# Patient Record
Sex: Male | Born: 1955 | Race: White | Hispanic: No | Marital: Married
Health system: Midwestern US, Community
[De-identification: ages and names within clinical notes are randomized; demographics above are authoritative.]

## PROBLEM LIST (undated history)

## (undated) DIAGNOSIS — Z1211 Encounter for screening for malignant neoplasm of colon: Secondary | ICD-10-CM

## (undated) DIAGNOSIS — G459 Transient cerebral ischemic attack, unspecified: Secondary | ICD-10-CM

## (undated) DIAGNOSIS — K5732 Diverticulitis of large intestine without perforation or abscess without bleeding: Secondary | ICD-10-CM

## (undated) DIAGNOSIS — E785 Hyperlipidemia, unspecified: Secondary | ICD-10-CM

## (undated) DIAGNOSIS — Z0000 Encounter for general adult medical examination without abnormal findings: Secondary | ICD-10-CM

## (undated) DIAGNOSIS — R300 Dysuria: Secondary | ICD-10-CM

## (undated) DIAGNOSIS — E1165 Type 2 diabetes mellitus with hyperglycemia: Principal | ICD-10-CM

---

## 2016-01-12 NOTE — Telephone Encounter (Addendum)
Patient Identity Verified with 2 Methods        Call Type    Referrals   PRIORITY Normal   Call back at Home Phone [phone]   Coordination of Care: Called and spoke with patient he was not in front of his calander and said he would call back to set up 48 hour holter monitor.   Initial call taken by: Dewitt HoesJenifer Longoria,  Dec 25, 2015 1:11 PM         Follow-up for Phone Call    Follow-up Details: Pt LM today at 1240. He states he would like to have the procedure code to call his insurance compnay to see how much they are going to cover. Left call back number [phone]   Follow-up by: Seward GraterAshley Higgins,  Dec 28, 2015 1:27 PM      Additional Follow-up for Phone Call    Additional Follow-up Details: Holter monitor for 48 hours is 93224, [id number] and [id number] which these are all the codes [facility] would use this is not including [employer]'s fee. He has already gotten the preauth through his PCP/ Referring office.    Additional Follow-up by: Dewitt HoesJenifer Longoria,  Dec 28, 2015 3:54 PM      Additional Follow-up for Phone Call    Additional Follow-up Details: Called patient back to let know codes and he will call us to schedule this after he gets more financial information.    Additional Follow-up by: Dewitt HoesJenifer Longoria,  Dec 29, 2015 9:57 AM            Electronically signed by Dewitt HoesJenifer Longoria on 12/29/2015 at 9:57 AM   ________________________________________________________________________

## 2016-01-12 NOTE — Telephone Encounter (Addendum)
Patient Identity Verified with 2 Methods        Call Type    VM   PRIORITY Normal   Time of Voicemail 1103   Date of Voicemail 01/12/2016   VM Message:   Pt LM looking to set up holter monitor. left call back number of [phone]   Initial call taken by: Seward GraterAshley Higgins,  Jan 12, 2016 11:12 AM         Follow-up for Phone Call    Follow-up Details: I called pt back. He wanted as early as possible tomorrow. I scheudled pt for 9 am as there was already and 8 and 830   Follow-up by: Seward GraterAshley Higgins,  Jan 12, 2016 11:13 AM            Electronically signed by Seward GraterAshley Higgins on 01/12/2016 at 11:13 AM   ________________________________________________________________________

## 2016-01-19 NOTE — Telephone Encounter (Signed)
----   Converted from flag ----   ---- 01/18/2016 9:16 PM, Thomasene RippleJustin Pearlman MD PhD wrote:   done; 3-4 beat multiform PVC run alert via [employer] flag      ---- 01/18/2016 8:34 AM, Dewitt HoesJenifer Longoria wrote:   48 hour holter ready for read   ------------------------------         Electronically signed by Dewitt HoesJenifer Longoria on 01/19/2016 at 1:00 PM   ________________________________________________________________________

## 2016-02-09 NOTE — Telephone Encounter (Addendum)
Patient Identity Verified with 2 Methods        Call Type    Acute   PRIORITY Normal   Initial Call Started: Seward GraterAshley Higgins,  February 08, 2016 10:48 AM   Reason for Acute Call: Pt called in today to see Referral had been received. Just got off the phone with PCP 5 mins prior. I told pt I would call him later today if we did not receive it.          Follow-up for Phone Call    Follow-up Details: I called pt back. Let him know that we have not recieved yet. He does have an insurance that might require an [place]auth. I told pt I would call him by the end of the week if we haven't received. Pt thanked me for the call back.    Follow-up by: Seward GraterAshley Higgins,  February 08, 2016 3:28 PM            Electronically signed by Seward GraterAshley Higgins on 02/08/2016 at 3:28 PM   ________________________________________________________________________   Called patient back, we decided on Aug. 3rd but lost connection before time determined. I have scheduld for 1pm          Electronically signed by Dewitt HoesJenifer Longoria on 02/09/2016 at 3:37 PM   ________________________________________________________________________   pt called back and confirmed 1pm for time on 8/3         Electronically signed by Dewitt HoesJenifer Longoria on 02/09/2016 at 3:41 PM   ________________________________________________________________________

## 2016-03-21 NOTE — Progress Notes (Addendum)
Visit Type:  Initial Consult         History of Present Illness:   This 60 year old gentleman is being evaluated for the first time by our office for palpitations.  There is a history of type 2 diabetes and hyperlipidemia.   In late May of this year this gentleman presented to urgent care with symptoms of an upper respiratory infection.  Extrasystoles were heard and an EKG was performed.  This revealed frequent PVCs.  Subsequent Holter monitoring was performed and this reveals sinus rhythm with 6.8% of total complexes being ventricular in etiology.  Sustaining forms did not occur ventricular tachycardia did not occur but there are 187 couplets noted PVC burden 10 to happen more so during the day and less so at night.  Recent blood work demonstrated an an LDL cholesterol 106 HDL of 38 triglycerides 209 glycohemoglobin 9.3 potassium 4.7 sodium 141.  Cardiac stress testing done 10 years ago was interpreted as normal   Patient denies any symptoms although he tells me couple months ago he was aware of very transient dizziness and some chest tightness he cannot recall whether that was exertional or not.  Cannot recall to radiate he is not sure if it was associated with breathlessness diaphoresis or how long it lasted.  He does have significant risk factors with type 2 diabetes hypertension which he does check at home and is usually quite well controlled in the 120 range.  He also has hyperlipidemia.  He has a daughter who is a Publishing rights manager a son-in-law who is an anesthesiologist who is concerned about his health and his risk factors.  I've reviewed in detail his Holter monitor with him his PVC burden and I've explained the PVCs are sometimes innocent namely when they are associated with an asymptomatic person and no structural heart disease and ischemia and that is our goal to assess by proceeding with a stress echo will contact the patient regarding results I've asked him to call me if he  develops any new or worrisome symptomsThe Review of Systems has been reviewed and updated (if necessary) by myself.??    Flossie Wexler DO  March 17, 2016 1:32 PM    Med reconciliation was reviewed by myself.    Also please see the handwritten instructions for the patient               Depression Screening    Over the last 2 weeks has often been bothered by feeling down, depressed or hopeless:  No   Over the last 2 weeks has often been bothered by little interest or pleasure in doing things:  No      Tobacco Use    Never smoker      Do you use other tobacco products?  no   Have you been or are you currently exposed to second hand smoke?  no      For men, in the past year how often have you had 5 or more drinks a day? never   In the past year, how often have you used prescription drugs for non medical reasons? (example: getting high) never   In the past year, how often have you used illegal drugs?  never      Nutrition    Caffeine use (drinks/day) 1      Motor Vehicle Safety    Do you fasten your seat belt when you are in a vehicle?  yes   Do you ever drive after drinking,or ride with  a driver who has been drinking? no   Do you talk on your mobile device while driving? no   Do you text on your mobile device while driving?  no         Review of Systems       ENT        Complains of hearing loss.        Denies visual changes.      The review of systems is negative for CV, General, Resp, GI, GU, Neuro, Psych, Heme, Endo, Derm and MS.         Physical Exam      General:       well developed, well nourished, in no acute distress   Head:       normocephalic and atraumatic   Lungs:       clear bilaterally to A & P   Heart:       regular rate and rhythm, S1, S2 without murmurs, rubs, gallops, or clicks   Msk:       no deformity or scoliosis noted with normal posture and gait   Pulses:       pulses normal in all 4 extremities   Extremities:       no clubbing, cyanosis, edema, or deformity noted with normal full  range of motion of all joints   Skin:       intact without lesions or rashes   Psych:       alert and cooperative; normal mood and affect; normal attention span and concentration         Vital Signs    Transition of Care:  PCP   Patient Identity Verified with 2 Methods  Yes      Pain Assessment    Are you having significant pain today that you would like to discuss with the provider you are seeing today?no   BP Location:  Left arm   BP Cuff Size:  Regular      Final BP: 150 / 77   Blood Pressure: 150/77 mm Hg   Pulse #1 86   Respirations: 16 /min      Oxygen Saturation    O2 Sat #1 98% Activity at rest    Height: 75 inches    Entered Weight 216.0 lb Calculated Weight: 216 lb.  98.18kg   Body Mass Index: 27.10      Body Surface Area (m2): 2.27      Documented By: Mikey College RMA (March 17, 2016 1:02 PM)      Medications were reviewed and reconciled with the patient during this visit including over the counter medications.       Patient has no known allergies.         Allergies:    No Known Allergies   Allergies were reviewed with the patient during this visit.            Pulse Oximetry    O2 Sat #1 98%    Pulmonology Testing Scores    Epworth Sleepiness Scale score:  3  (Goal should be a score less than 9.)               Impression & Recommendations:      Problem # 1:  Chest tightness (EXB-284.13) (ICD10-R07.89)   Assessment: New   See HPI for plan the patient has not had any of this in the past several months despite being physically active including working out at the gym on  an elliptical, I've asked patient call me if he develops any new or unusual symptoms.  Plan is to communicate stress results to him that the stress test is negative we will assume his ventricular ectopics are benign.  I'm happy to reevaluate him as needed   Orders:   Ofc Vst, New Lvl 4 ([id number](CPT-99204)   Stress Echo ([id number](CPT-93350)         Problem # 2:  Hypertension (ICD-401.9) (ICD10-I10)   Assessment: Unchanged    This appears to be episodic and related to clinical visits per patient   His updated medication list for this problem includes:      Lisinopril 5 Mg Oral Tabs (Lisinopril) .Marland Kitchen.... 1 by mouth daily      Orders:   Ofc Vst, New Lvl 4 ([id number](CPT-99204)   Stress Echo ([id number](CPT-93350)         Problem # 3:  Mixed hyperlipidemia (ICD-272.2) (ICD10-E78.2)   Assessment: Unchanged   Continue statin therapy, monitor   His updated medication list for this problem includes:      Atorvastatin Calcium 20 Mg Oral Tabs (Atorvastatin calcium) .Marland Kitchen.... 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 1 by mouth daily      Orders:   Ofc Vst, New Lvl 4 ([id number](CPT-99204)   Stress Echo ([id number](CPT-93350)                     Orders:   Added new Service order of Ofc Vst, New Lvl 4 [phone](CPT-99204) - Signed   Added new Test order of Stress Echo ([id number](CPT-93350) - Signed      ]      EPWORTH SLEEPINESS SCALE   KEY:  <10 (Normal range), 10 to < 12 (Borderline), 12-24 (Abnormal)    Epworth Score: 3     Score Categorization: Normal range        KEY:  0 = Would never doze, 1 = Slight chance of dozing, 2 = Moderate chance of dozing, 3 = High chance of dozing    Sitting and Reading: 1   Watching TV: 1   Sitting inactive in a public place (ex. a theater or meeting): 0   As a passenger in a car for an hour without a break: 0   Lying down to rest in the afternoon when circumstances permit: 1   Sitting and talking to someone: 0   Sitting quietly after a lunch without alcohol: 0   In a car, while stopped for a few minutes in traffic:  0            Electronically signed by Lorin PicketScott Anaisabel Pederson DO on 03/17/2016 at 1:36 PM   ________________________________________________________________________

## 2016-04-13 NOTE — Telephone Encounter (Addendum)
Patient Identity Verified with 2 Methods        Call Type    VM   PRIORITY Normal   Time of Voicemail 1222   Date of Voicemail 04/13/2016   VM Message:   PT LM stating he was returning a call. Left call back of [phone].    Initial call taken by: Kathrine Haddock,  April 13, 2016 1:26 PM         Follow-up for Phone Call    Follow-up Details: I don't see a note in the chart will forward to provider to see if was trying to reach.    Follow-up by: Kathrine Haddock,  April 13, 2016 1:27 PM      Additional Follow-up for Phone Call    Additional Follow-up Details: I don't recall trying to reach the patient it's been approximately a month since he was here I see that his stress test has not been performed, it's been nearly a month are we out that long for scheduling?  Continue check to see what the status of the stress test is?  Was it upstairs trying to call him to schedule this?   Additional Follow-up by: Donaciano Eva DO,  April 13, 2016 1:46 PM      Additional Follow-up for Phone Call    Additional Follow-up Details: I called Shanda Bumps from [facility] to see if it was her. She is going to check. She was in the middle of another chart is going to call back.    Additional Follow-up by: Kathrine Haddock,  April 13, 2016 2:05 PM      Additional Follow-up for Phone Call    Additional Follow-up Details: Shanda Bumps called back. It was pre registration for pt's scan. Shanda Bumps states pt has spoken to them now and is all set for 9/12 appt.    Additional Follow-up by: Kathrine Haddock,  April 13, 2016 2:12 PM            Electronically signed by Kathrine Haddock on 04/13/2016 at 2:12 PM   Electronically signed by Donaciano Eva DO on 04/13/2016 at 2:31 PM   ________________________________________________________________________

## 2016-04-26 NOTE — Telephone Encounter (Signed)
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  Please asked him to call as needed for any worrisome symptoms and please asked him to make an appointment to see me in a year thank you   ------------------------------      Call Type    Results   PRIORITY Normal   Results:   Phoned [name], [facility] to contact the office.    Initial call taken by: Mikey College RMA,  April 26, 2016 2:42 PM         Follow-up for Phone Call    Follow-up Details: pt called back is advised of above. Letters will go out for 1 year follow up.    Follow-up by: Kathrine Haddock,  April 26, 2016 3:28 PM            Electronically signed by Kathrine Haddock on 04/26/2016 at 3:28 PM   ________________________________________________________________________

## 2018-08-31 DIAGNOSIS — R55 Syncope and collapse: Secondary | ICD-10-CM

## 2018-08-31 NOTE — ED Triage Notes (Signed)
States has had a couple of drinks tonight, wife stares he was pale and sweaty at the time. Does not remember getting up between falls. 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 States did not feel like he was having pvcs tonight.

## 2018-08-31 NOTE — ED Provider Notes (Signed)
Patient is a 63 year old male whom describes history of hypertension as well as diabetes for which he takes medications as curiously he has spent the last couple of weeks in [place], returning 48 hours ago during which time he inadvertently did not have his lisinopril as he returned home 48 hours ago has re-initiated this at a routine 5 mg daily dose as over the last 3 days he has had significant sinus congestion cough and nasal stuffiness for which he is taking an over-the-counter Mucinex DM product as a this evening he was simply standing around with friends having a few drinks when suddenly his wife observed that he seemed to stare off for a brief moment before he was collapsing to the ground possibly landing on his butt and elbows.  She and friends nearly immediately attempted to help him stand because he appeared to be awake and yet he again crumpled to the ground at this time seemingly unresponsive to communications as there was no evidence of seizure activity he had no loss of urine as he did become profoundly diaphoretic and ???he looked gray??? the wife states friends immediately called for an ambulance as after a few minutes possibly 2 3 or 5 he began to ask what was going on he began to recognize them he immediately began asking questions that were coherent and he seemed understand as his color seemed to quickly improve and any diaphoresis resolved.  The patient recalls slowly the reality of waking up on the floor he vaguely recalls feeling hot immediately before he collapsed he has no recollection of being assisted back up off the ground before collapsing again he was unaware of any headache, chest pain, palpitations, dyspnea, lightheadedness nor weakness nor visual change.  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  With the exception of the elimination of his lisinopril for 2  weeks he has had no medication changes he states with the cold symptoms he is unaware of any fever nor chills he has been eating regularly he has had no vomiting or diarrhea he has had no rash no urinary symptoms.  The patient states he had no paresthesias he did not could lose control of his urine he did not suffer any injury he was not observed to have any twitching or seizure activity    The history is provided by the patient.   Syncope    This is a new problem. The current episode started less than 1 hour ago. The problem has been resolved. He lost consciousness for a period of 1 to 5 minutes. Associated symptoms include congestion and light-headedness (Patient states heHas the vagus of feelings of lightheadedness while sitting in our emergency room waiting room). Pertinent negatives include no visual change, no chest pain, no palpitations, no confusion, no fever, no abdominal pain, no bowel incontinence, no vomiting, no bladder incontinence, no headaches, no back pain, no dizziness, no focal weakness, no seizures, no slurred speech, no weakness and no head injury.        No past medical history on file.    No past surgical history on file.      No family history on file.    Social History     Socioeconomic History   ??? Marital status: MARRIED     Spouse name: Not on file   ??? Number of children: Not  on file   ??? Years of education: Not on file   ??? Highest education level: Not on file   Occupational History   ??? Not on file   Social Needs   ??? Financial resource strain: Not on file   ??? Food insecurity:     Worry: Not on file     Inability: Not on file   ??? Transportation needs:     Medical: Not on file     Non-medical: Not on file   Tobacco Use   ??? Smoking status: Not on file   Substance and Sexual Activity   ??? Alcohol use: Not on file   ??? Drug use: Not on file   ??? Sexual activity: Not on file   Lifestyle   ??? Physical activity:     Days per week: Not on file     Minutes per session: Not on file    ??? Stress: Not on file   Relationships   ??? Social connections:     Talks on phone: Not on file     Gets together: Not on file     Attends religious service: Not on file     Active member of club or organization: Not on file     Attends meetings of clubs or organizations: Not on file     Relationship status: Not on file   ??? Intimate partner violence:     Fear of current or ex partner: Not on file     Emotionally abused: Not on file     Physically abused: Not on file     Forced sexual activity: Not on file   Other Topics Concern   ??? Not on file   Social History Narrative   ??? Not on file         ALLERGIES: Patient has no allergy information on record.    Review of Systems   Constitutional: Negative for chills and fever.   HENT: Positive for congestion. Negative for sore throat.    Respiratory: Negative for cough.    Cardiovascular: Positive for syncope. Negative for chest pain and palpitations.   Gastrointestinal: Negative for abdominal pain, bowel incontinence, diarrhea and vomiting.   Genitourinary: Negative for bladder incontinence, difficulty urinating and dysuria.   Musculoskeletal: Negative for back pain and myalgias.   Skin: Negative for rash.   Neurological: Positive for light-headedness (Patient states heHas the vagus of feelings of lightheadedness while sitting in our emergency room waiting room). Negative for dizziness, focal weakness, seizures, weakness and headaches.   Psychiatric/Behavioral: Negative for confusion.       Vitals:    08/31/18 2143   BP: 132/74   Pulse: 91   Resp: 18   Temp: 98.1 ??F (36.7 ??C)   SpO2: 96%            Physical Exam  Vitals signs and nursing note reviewed.   Constitutional:       General: He is not in acute distress.     Appearance: He is well-developed. He is not ill-appearing or diaphoretic.   HENT:      Head: Atraumatic.      Right Ear: Tympanic membrane normal. No hemotympanum.      Left Ear: Tympanic membrane normal. No hemotympanum.      Mouth/Throat:       Pharynx: Uvula midline. No pharyngeal swelling or posterior oropharyngeal erythema.   Eyes:      Pupils: Pupils are equal, round, and reactive to light.  Neck:      Musculoskeletal: Neck supple.      Vascular: No JVD.      Trachea: Trachea normal.   Cardiovascular:      Rate and Rhythm: Normal rate and regular rhythm.      Pulses:           Radial pulses are 2+ on the right side and 2+ on the left side.        Dorsalis pedis pulses are 2+ on the right side and 2+ on the left side.      Heart sounds: Normal heart sounds. No murmur.   Pulmonary:      Effort: Pulmonary effort is normal. No respiratory distress.      Breath sounds: Normal breath sounds. No decreased breath sounds.   Abdominal:      Palpations: Abdomen is soft.      Tenderness: There is no tenderness.   Musculoskeletal:         General: No swelling or tenderness.      Right lower leg: No edema.      Left lower leg: No edema.   Lymphadenopathy:      Cervical: No cervical adenopathy.      Upper Body:      Right upper body: No supraclavicular adenopathy.      Left upper body: No supraclavicular adenopathy.   Skin:     General: Skin is warm and dry.   Neurological:      General: No focal deficit present.      Mental Status: He is alert and oriented to person, place, and time.      Cranial Nerves: Cranial nerves are intact.      Sensory: Sensation is intact.      Motor: Motor function is intact.      Coordination: Romberg sign negative. Finger-Nose-Finger Test normal.      Gait: Gait is intact.   Psychiatric:         Attention and Perception: Attention and perception normal.         Mood and Affect: Mood and affect normal. Mood is not anxious.         Speech: Speech normal.         Behavior: Behavior normal. Behavior is cooperative.         Thought Content: Thought content normal.         Cognition and Memory: Cognition and memory normal.         Judgment: Judgment normal.          MDM  Number of Diagnoses or Management Options   Syncope and collapse: new and requires workup  Diagnosis management comments: Assuredly this patient who appears to have suffered a true syncopal episode without obvious seizure activity with no significant preceding nor subsequent symptoms who does not thought to have suffered an injury requires evaluation for this syncope we will attempt to rule out concerns of arrhythmia depending on symptoms I have no concern of stroke doubt acute coronary syndrome doubt infection with alcohol level returned at 80 prior to me seeing him do not believe it is secondary to his alcohol consumption we will observe for orthostatic changes evaluate his gait subsequent to a time of observation as we discussed this plan from outset with patient       Amount and/or Complexity of Data Reviewed  Clinical lab tests: ordered and reviewed    Patient Progress  Patient progress: improved    ED Course as of  Sep 01 600   Sat Sep 01, 2018   0115 Patient showing no evidence of ectopy here heart rate continues to run in the 80 range, some vague lightheadedness remains as when he stands he actually feels slightly off balance here we have not yet obtained orthostatics (miscommunication with nursing0 will check on these if abnormal we will provide fluid bolus if not despite normal neurologic exam else wise will obtain a head CT provide some Antivert as will continue to observe him on a monitor    [CA]   0131 CuriouslyAlthough blood pressure falls only small amount heart rate does jump significantly with standing as he again feels rather unsteady to this end we have discussed providin a  of of saline as well as a dose of Antivert and yet despite the normal neurologic examination based on true loss of consciousness the subsequent unsteadiness believe it is reasonable to obtain head CT is he seems appreciative of these plans/recommendations    [CA]   [phone] Patient describes to nursing after fluids and Antivert he is feeling  improved less concerns of unsteadiness/dizziness awaiting repeat troponin as CT appears to show no acute intracranial abnormality    [CA]      ED Course User Index  [CA] Corine Shelter, MD       Procedures      NIH Stroke Scale          Lab findings during this visit (only abnormal values will be noted, if no value noted then the result was normal range):  Labs Reviewed   CBC WITH AUTOMATED DIFF - Abnormal; Notable for the following components:       Result Value    RDW 13.7 (*)     ABS. MONOCYTES 1.2 (*)     All other components within normal limits   METABOLIC PANEL, COMPREHENSIVE - Abnormal; Notable for the following components:    Chloride 99 (*)     Glucose 162 (*)     Creatinine 1.29 (*)     GFR est non-AA 60 (*)     ALT (SGPT) 64 (*)     AST (SGOT) 47 (*)     Alk. phosphatase 102 (*)     All other components within normal limits   ETHYL ALCOHOL - Abnormal; Notable for the following components:    ETHYL ALCOHOL 80.9 (*)     All other components within normal limits   TROPONIN I   D DIMER   MAGNESIUM   TROPONIN I       Radiology studies during this visit  Ct Head Wo Cont    Result Date: 09/01/2018  1. No acute intracranial abnormality. 2. Mild paranasal sinus disease. THIS IS AN ELECTRONICALLY VERIFIED REPORT Dictated By:  Marcell Barlow MD Dictated:  31 01:57:45 Transcribed By:  Self-Edited - PowerScribe Transcribed:  09/01/2018 01:58:22 Signed By:  Marcell Barlow MD Signed:  09/01/2018 02:01:48 For questions regarding this report please contact [facility] at [phone] [facility]: [id number]      Medications given in the ED:  Medications   sodium chloride (NS) flush 5-10 mL (has no administration in time range)   sodium chloride 0.9 % bolus infusion 1,000 mL (0 mL IntraVENous IV Completed 09/01/18 0305)   meclizine (ANTIVERT) tablet 50 mg (50 mg Oral Given 09/01/18 0155)       Diagnosis:    ICD-10-CM ICD-9-CM   1. Syncope and collapse R55 780.2       Condition at disposition:  Condition improved     Disposition:  Discharged    Discharge prescriptions and/or changes if applicable:  There are no discharge medications for this patient.      Follow-up:  Cyril Mourning, MD  [address]  [place] [address]  [phone]      As needed    [place]  [address]  [place] [address]  [phone]    If symptoms worsen      Please note that portions of this document were created using the M*Modal Fluency Direct dictation system.  Any inconsistencies or typographical errors may be the result of mis-transcription that persist in spite of proof-reading and should be addressed with the document creator.

## 2018-08-31 NOTE — ED Provider Notes (Signed)
Patient is a 63 year old male whom describes history of hypertension as well as diabetes for which he takes medications as curiously he has spent the last couple of weeks in [place], returning 48 hours ago during which time he inadvertently did not have his lisinopril as he returned home 48 hours ago has re-initiated this at a routine 5 mg daily dose as over the last 3 days he has had significant sinus congestion cough and nasal stuffiness for which he is taking an over-the-counter Mucinex DM product as a this evening he was simply standing around with friends having a few drinks when suddenly his wife observed that he seemed to stare off for a brief moment before he was collapsing to the ground possibly landing on his butt and elbows.  She and friends nearly immediately attempted to help him stand because he appeared to be awake and yet he again crumpled to the ground at this time seemingly unresponsive to communications as there was no evidence of seizure activity he had no loss of urine as he did become profoundly diaphoretic and ???he looked gray??? the wife states friends immediately called for an ambulance as after a few minutes possibly 2 3 or 5 he began to ask what was going on he began to recognize them he immediately began asking questions that were coherent and he seemed understand as his color seemed to quickly improve and any diaphoresis resolved.  The patient recalls slowly the reality of waking up on the floor he vaguely recalls feeling hot immediately before he collapsed he has no recollection of being assisted back up off the ground before collapsing again he was unaware of any headache, chest pain, palpitations, dyspnea, lightheadedness nor weakness nor visual change.  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  With the exception of the elimination of his lisinopril for 2  weeks he has had no medication changes he states with the cold symptoms he is unaware of any fever nor chills he has been eating regularly he has had no vomiting or diarrhea he has had no rash no urinary symptoms.  The patient states he had no paresthesias he did not could lose control of his urine he did not suffer any injury he was not observed to have any twitching or seizure activity    The history is provided by the patient.   Syncope    This is a new problem. The current episode started less than 1 hour ago. The problem has been resolved. He lost consciousness for a period of 1 to 5 minutes. Associated symptoms include congestion and light-headedness (Patient states heHas the vagus of feelings of lightheadedness while sitting in our emergency room waiting room). Pertinent negatives include no visual change, no chest pain, no palpitations, no confusion, no fever, no abdominal pain, no bowel incontinence, no vomiting, no bladder incontinence, no headaches, no back pain, no dizziness, no focal weakness, no seizures, no slurred speech, no weakness and no head injury.        No past medical history on file.    No past surgical history on file.      No family history on file.    Social History     Socioeconomic History   ??? Marital status: MARRIED     Spouse name: Not on file   ??? Number of children: Not  on file   ??? Years of education: Not on file   ??? Highest education level: Not on file   Occupational History   ??? Not on file   Social Needs   ??? Financial resource strain: Not on file   ??? Food insecurity:     Worry: Not on file     Inability: Not on file   ??? Transportation needs:     Medical: Not on file     Non-medical: Not on file   Tobacco Use   ??? Smoking status: Not on file   Substance and Sexual Activity   ??? Alcohol use: Not on file   ??? Drug use: Not on file   ??? Sexual activity: Not on file   Lifestyle   ??? Physical activity:     Days per week: Not on file     Minutes per session: Not on file   ??? Stress: Not on file    Relationships   ??? Social connections:     Talks on phone: Not on file     Gets together: Not on file     Attends religious service: Not on file     Active member of club or organization: Not on file     Attends meetings of clubs or organizations: Not on file     Relationship status: Not on file   ??? Intimate partner violence:     Fear of current or ex partner: Not on file     Emotionally abused: Not on file     Physically abused: Not on file     Forced sexual activity: Not on file   Other Topics Concern   ??? Not on file   Social History Narrative   ??? Not on file         ALLERGIES: Patient has no allergy information on record.    Review of Systems   Constitutional: Negative for chills and fever.   HENT: Positive for congestion. Negative for sore throat.    Respiratory: Negative for cough.    Cardiovascular: Positive for syncope. Negative for chest pain and palpitations.   Gastrointestinal: Negative for abdominal pain, bowel incontinence, diarrhea and vomiting.   Genitourinary: Negative for bladder incontinence, difficulty urinating and dysuria.   Musculoskeletal: Negative for back pain and myalgias.   Skin: Negative for rash.   Neurological: Positive for light-headedness (Patient states heHas the vagus of feelings of lightheadedness while sitting in our emergency room waiting room). Negative for dizziness, focal weakness, seizures, weakness and headaches.   Psychiatric/Behavioral: Negative for confusion.       Vitals:    08/31/18 2143   BP: 132/74   Pulse: 91   Resp: 18   Temp: 98.1 ??F (36.7 ??C)   SpO2: 96%            Physical Exam  Vitals signs and nursing note reviewed.   Constitutional:       General: He is not in acute distress.     Appearance: He is well-developed. He is not ill-appearing or diaphoretic.   HENT:      Head: Atraumatic.      Right Ear: Tympanic membrane normal. No hemotympanum.      Left Ear: Tympanic membrane normal. No hemotympanum.      Mouth/Throat:      Pharynx: Uvula midline. No pharyngeal  swelling or posterior oropharyngeal erythema.   Eyes:      Pupils: Pupils are equal, round, and reactive to light.  Neck:      Musculoskeletal: Neck supple.      Vascular: No JVD.      Trachea: Trachea normal.   Cardiovascular:      Rate and Rhythm: Normal rate and regular rhythm.      Pulses:           Radial pulses are 2+ on the right side and 2+ on the left side.        Dorsalis pedis pulses are 2+ on the right side and 2+ on the left side.      Heart sounds: Normal heart sounds. No murmur.   Pulmonary:      Effort: Pulmonary effort is normal. No respiratory distress.      Breath sounds: Normal breath sounds. No decreased breath sounds.   Abdominal:      Palpations: Abdomen is soft.      Tenderness: There is no tenderness.   Musculoskeletal:         General: No swelling or tenderness.      Right lower leg: No edema.      Left lower leg: No edema.   Lymphadenopathy:      Cervical: No cervical adenopathy.      Upper Body:      Right upper body: No supraclavicular adenopathy.      Left upper body: No supraclavicular adenopathy.   Skin:     General: Skin is warm and dry.   Neurological:      General: No focal deficit present.      Mental Status: He is alert and oriented to person, place, and time.      Cranial Nerves: Cranial nerves are intact.      Sensory: Sensation is intact.      Motor: Motor function is intact.      Coordination: Romberg sign negative. Finger-Nose-Finger Test normal.      Gait: Gait is intact.   Psychiatric:         Attention and Perception: Attention and perception normal.         Mood and Affect: Mood and affect normal. Mood is not anxious.         Speech: Speech normal.         Behavior: Behavior normal. Behavior is cooperative.         Thought Content: Thought content normal.         Cognition and Memory: Cognition and memory normal.         Judgment: Judgment normal.          MDM  Number of Diagnoses or Management Options  Syncope and collapse: new and requires workup  Diagnosis management  comments: Assuredly this patient who appears to have suffered a true syncopal episode without obvious seizure activity with no significant preceding nor subsequent symptoms who does not thought to have suffered an injury requires evaluation for this syncope we will attempt to rule out concerns of arrhythmia depending on symptoms I have no concern of stroke doubt acute coronary syndrome doubt infection with alcohol level returned at 80 prior to me seeing him do not believe it is secondary to his alcohol consumption we will observe for orthostatic changes evaluate his gait subsequent to a time of observation as we discussed this plan from outset with patient       Amount and/or Complexity of Data Reviewed  Clinical lab tests: ordered and reviewed    Patient Progress  Patient progress: improved    ED Course as of  Sep 01 600   Sat Sep 01, 2018   0115 Patient showing no evidence of ectopy here heart rate continues to run in the 80 range, some vague lightheadedness remains as when he stands he actually feels slightly off balance here we have not yet obtained orthostatics (miscommunication with nursing0 will check on these if abnormal we will provide fluid bolus if not despite normal neurologic exam else wise will obtain a head CT provide some Antivert as will continue to observe him on a monitor    [CA]   0131 CuriouslyAlthough blood pressure falls only small amount heart rate does jump significantly with standing as he again feels rather unsteady to this end we have discussed providin a  of of saline as well as a dose of Antivert and yet despite the normal neurologic examination based on true loss of consciousness the subsequent unsteadiness believe it is reasonable to obtain head CT is he seems appreciative of these plans/recommendations    [CA]   [phone] Patient describes to nursing after fluids and Antivert he is feeling improved less concerns of unsteadiness/dizziness awaiting repeat troponin as CT appears to show no  acute intracranial abnormality    [CA]      ED Course User Index  [CA] Corine Shelter, MD       Procedures      NIH Stroke Scale          Lab findings during this visit (only abnormal values will be noted, if no value noted then the result was normal range):  Labs Reviewed   CBC WITH AUTOMATED DIFF - Abnormal; Notable for the following components:       Result Value    RDW 13.7 (*)     ABS. MONOCYTES 1.2 (*)     All other components within normal limits   METABOLIC PANEL, COMPREHENSIVE - Abnormal; Notable for the following components:    Chloride 99 (*)     Glucose 162 (*)     Creatinine 1.29 (*)     GFR est non-AA 60 (*)     ALT (SGPT) 64 (*)     AST (SGOT) 47 (*)     Alk. phosphatase 102 (*)     All other components within normal limits   ETHYL ALCOHOL - Abnormal; Notable for the following components:    ETHYL ALCOHOL 80.9 (*)     All other components within normal limits   TROPONIN I   D DIMER   MAGNESIUM   TROPONIN I       Radiology studies during this visit  Ct Head Wo Cont    Result Date: 09/01/2018  1. No acute intracranial abnormality. 2. Mild paranasal sinus disease. THIS IS AN ELECTRONICALLY VERIFIED REPORT Dictated By:  Marcell Barlow MD Dictated:  26 01:57:45 Transcribed By:  Self-Edited - PowerScribe Transcribed:  09/01/2018 01:58:22 Signed By:  Marcell Barlow MD Signed:  09/01/2018 02:01:48 For questions regarding this report please contact [facility] at [phone] [facility]: [email or web address]      Medications given in the ED:  Medications   sodium chloride (NS) flush 5-10 mL (has no administration in time range)   sodium chloride 0.9 % bolus infusion 1,000 mL (0 mL IntraVENous IV Completed 09/01/18 0305)   meclizine (ANTIVERT) tablet 50 mg (50 mg Oral Given 09/01/18 0155)       Diagnosis:    ICD-10-CM ICD-9-CM   1. Syncope and collapse R55 780.2       Condition at disposition:  Condition improved    Disposition:  Discharged    Discharge prescriptions and/or changes if applicable:  There are no  discharge medications for this patient.      Follow-up:  Cyril Mourning, MD  [address]  [place] [address]  [phone]      As needed    [facility]  [address]  [place] [address]  [phone]    If symptoms worsen      Please note that portions of this document were created using the M*Modal Fluency Direct dictation system.  Any inconsistencies or typographical errors may be the result of mis-transcription that persist in spite of proof-reading and should be addressed with the document creator.

## 2018-08-31 NOTE — ED Notes (Signed)
States has had a couple of drinks tonight, wife stares he was pale and sweaty at the time. Does not remember getting up between falls. 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 States did not feel like he was having pvcs tonight.

## 2018-09-01 ENCOUNTER — Inpatient Hospital Stay
Admit: 2018-09-01 | Discharge: 2018-09-01 | Disposition: A | Discharge status: Home / Self Care | Payer: PRIVATE HEALTH INSURANCE | Attending: Emergency Medicine

## 2018-09-01 ENCOUNTER — Emergency Department: Admit: 2018-09-01 | Payer: PRIVATE HEALTH INSURANCE

## 2018-09-01 LAB — TROPONIN
Troponin I: <0.04 ng/mL (ref <0.040)
Troponin I: <0.04 ng/mL (ref <0.040)

## 2018-09-01 LAB — CBC WITH AUTO DIFFERENTIAL
Basophils %: 0 %
Basophils Absolute: 0 10*3/uL (ref 0.0–0.2)
Eosinophils %: 3 %
Eosinophils Absolute: 0.2 10*3/uL (ref 0.0–0.5)
Granulocyte Absolute Count: 0.1 10*3/uL (ref 0.0–0.1)
Hematocrit: 44.6 % (ref 42.0–52.0)
Hemoglobin: 14.9 g/dL (ref 14.0–18.0)
Immature Granulocytes: 1 %
Lymphocytes %: 21 %
Lymphocytes Absolute: 1.4 10*3/uL (ref 1.0–4.5)
MCH: 29.8 PG (ref 28.0–34.0)
MCHC: 33.4 g/dL (ref 32.0–36.0)
MCV: 89.2 FL (ref 80.0–100.0)
MPV: 8.4 FL (ref 7.0–12.0)
Monocytes %: 17 %
Monocytes Absolute: 1.2 10*3/uL — ABNORMAL HIGH (ref 0.1–0.8)
NRBC Absolute: 0 10*3/uL
Neutrophils %: 58 %
Neutrophils Absolute: 3.8 10*3/uL (ref 1.9–7.8)
Nucleated RBCs: 0 PER 100 WBC
Platelets: 191 10*3/uL (ref 150–400)
RBC: 5 M/uL (ref 4.50–6.00)
RDW: 13.7 % — ABNORMAL HIGH (ref 11.5–13.5)
WBC: 6.7 10*3/uL (ref 4.8–10.8)

## 2018-09-01 LAB — COMPREHENSIVE METABOLIC PANEL
ALT: 64 U/L — ABNORMAL HIGH (ref 3–35)
AST: 47 U/L — ABNORMAL HIGH (ref 15–40)
Albumin/Globulin Ratio: 1.3
Albumin: 4.3 g/dL (ref 3.5–5.0)
Alkaline Phosphatase: 102 U/L — ABNORMAL HIGH (ref 35–100)
Anion Gap: 17 mmol/L
BUN: 16 MG/DL (ref 7–20)
Bun/Cre Ratio: 12 NA
CO2: 24 mmol/L (ref 20–32)
Calcium: 9.7 MG/DL (ref 8.8–10.5)
Chloride: 99 mmol/L — ABNORMAL LOW (ref 100–110)
Creatinine: 1.29 MG/DL — ABNORMAL HIGH (ref 0.40–1.20)
EGFR IF NonAfrican American: 60 mL/min/{1.73_m2} — ABNORMAL LOW (ref >60)
GFR African American: >60 mL/min/{1.73_m2} (ref >60)
Globulin: 3.3 g/dL
Glucose: 162 mg/dL — ABNORMAL HIGH (ref 75–110)
Potassium: 3.7 mmol/L (ref 3.5–5.0)
Sodium: 136 mmol/L (ref 135–145)
Total Bilirubin: 0.7 mg/dL (ref 0.10–1.20)
Total Protein: 7.6 g/dL (ref 6.2–8.0)

## 2018-09-01 LAB — ETHYL ALCOHOL
ETHYL ALCOHOL: 80.9 MG/DL — ABNORMAL HIGH (ref <5.0)
Ethyl Alcohol: 80.9 MG/DL — ABNORMAL HIGH (ref <5.0)

## 2018-09-01 LAB — MAGNESIUM
Magnesium: 2.2 mg/dL (ref 1.7–2.5)
Magnesium: 2.2 mg/dL (ref 1.7–2.5)

## 2018-09-01 LAB — D-DIMER, QUANTITATIVE: D-Dimer, Quant: 0.37 mg/L FEU (ref 0.00–0.49)

## 2018-09-01 LAB — CBC WITH AUTOMATED DIFF
ABS. BASOPHILS: 0 10*3/uL (ref 0.0–0.2)
ABS. EOSINOPHILS: 0.2 10*3/uL (ref 0.0–0.5)
ABS. IMM. GRANS.: 0.1 10*3/uL (ref 0.0–0.1)
ABS. LYMPHOCYTES: 1.4 10*3/uL (ref 1.0–4.5)
ABS. MONOCYTES: 1.2 10*3/uL — ABNORMAL HIGH (ref 0.1–0.8)
ABS. NEUTROPHILS: 3.8 10*3/uL (ref 1.9–7.8)
ABSOLUTE NRBC: 0 10*3/uL
BASOPHILS: 0 %
EOSINOPHILS: 3 %
HCT: 44.6 % (ref 42.0–52.0)
HGB: 14.9 g/dL (ref 14.0–18.0)
IMMATURE GRANULOCYTES: 1 %
LYMPHOCYTES: 21 %
MCH: 29.8 PG (ref 28.0–34.0)
MCHC: 33.4 g/dL (ref 32.0–36.0)
MCV: 89.2 FL (ref 80.0–100.0)
MONOCYTES: 17 %
MPV: 8.4 FL (ref 7.0–12.0)
NEUTROPHILS: 58 %
NRBC: 0 PER 100 WBC
PLATELET: 191 10*3/uL (ref 150–400)
RBC: 5 M/uL (ref 4.50–6.00)
RDW: 13.7 % — ABNORMAL HIGH (ref 11.5–13.5)
WBC: 6.7 10*3/uL (ref 4.8–10.8)

## 2018-09-01 LAB — METABOLIC PANEL, COMPREHENSIVE
A-G Ratio: 1.3
ALT (SGPT): 64 U/L — ABNORMAL HIGH (ref 3–35)
AST (SGOT): 47 U/L — ABNORMAL HIGH (ref 15–40)
Albumin: 4.3 g/dL (ref 3.5–5.0)
Alk. phosphatase: 102 U/L — ABNORMAL HIGH (ref 35–100)
Anion gap: 17 mmol/L
BUN/Creatinine ratio: 12
BUN: 16 MG/DL (ref 7–20)
Bilirubin, total: 0.7 mg/dL (ref 0.10–1.20)
CO2: 24 mmol/L (ref 20–32)
Calcium: 9.7 MG/DL (ref 8.8–10.5)
Chloride: 99 mmol/L — ABNORMAL LOW (ref 100–110)
Creatinine: 1.29 MG/DL — ABNORMAL HIGH (ref 0.40–1.20)
GFR est AA: >60 mL/min/{1.73_m2} (ref >60)
GFR est non-AA: 60 mL/min/{1.73_m2} — ABNORMAL LOW (ref >60)
Globulin: 3.3 g/dL
Glucose: 162 mg/dL — ABNORMAL HIGH (ref 75–110)
Potassium: 3.7 mmol/L (ref 3.5–5.0)
Protein, total: 7.6 g/dL (ref 6.2–8.0)
Sodium: 136 mmol/L (ref 135–145)

## 2018-09-01 LAB — D DIMER: D-dimer: 0.37 mg/L FEU (ref 0.00–0.49)

## 2018-09-01 LAB — TROPONIN I
Troponin-I, Qt.: <0.04 ng/mL (ref <0.040)
Troponin-I, Qt.: <0.04 ng/mL (ref <0.040)

## 2018-09-01 MED ORDER — MECLIZINE 12.5 MG TAB
12.5 mg | ORAL | Status: AC
Start: 2018-09-01 — End: 2018-09-01
  Administered 2018-09-01: 07:00:00 via ORAL

## 2018-09-01 MED ORDER — SODIUM CHLORIDE 0.9 % IJ SYRG
Freq: Once | INTRAMUSCULAR | Status: DC
Start: 2018-09-01 — End: 2018-09-01

## 2018-09-01 MED ORDER — SODIUM CHLORIDE 0.9% BOLUS IV
0.9 % | Freq: Once | INTRAVENOUS | Status: AC
Start: 2018-09-01 — End: 2018-09-01
  Administered 2018-09-01: 07:00:00 via INTRAVENOUS

## 2018-09-01 MED FILL — BD POSIFLUSH NORMAL SALINE 0.9 % INJECTION SYRINGE: INTRAMUSCULAR | Qty: 10

## 2018-09-01 MED FILL — SODIUM CHLORIDE 0.9 % IV: INTRAVENOUS | Qty: 1000

## 2018-09-01 MED FILL — MECLIZINE 12.5 MG TAB: 12.5 mg | ORAL | Qty: 4

## 2019-11-05 IMAGING — DX FOOT 3 VIEWS RIGHT
1 series · 4 of 4 positions shown · non-contrast
Comparison: none

FOOT 3 VIEWS RIGHT, 11/05/2019 [DATE]: 
CLINICAL INDICATION:  History of trauma right first toe on 10/25/2019. Patient 
dropped 45 weight on right first toe. Crushing injury. 
COMPARISON EXAMINATIONS: None prior.

[Series 1: AP · U · 0.12mm/px · 4 of 4 slices shown]
[im 1/4]
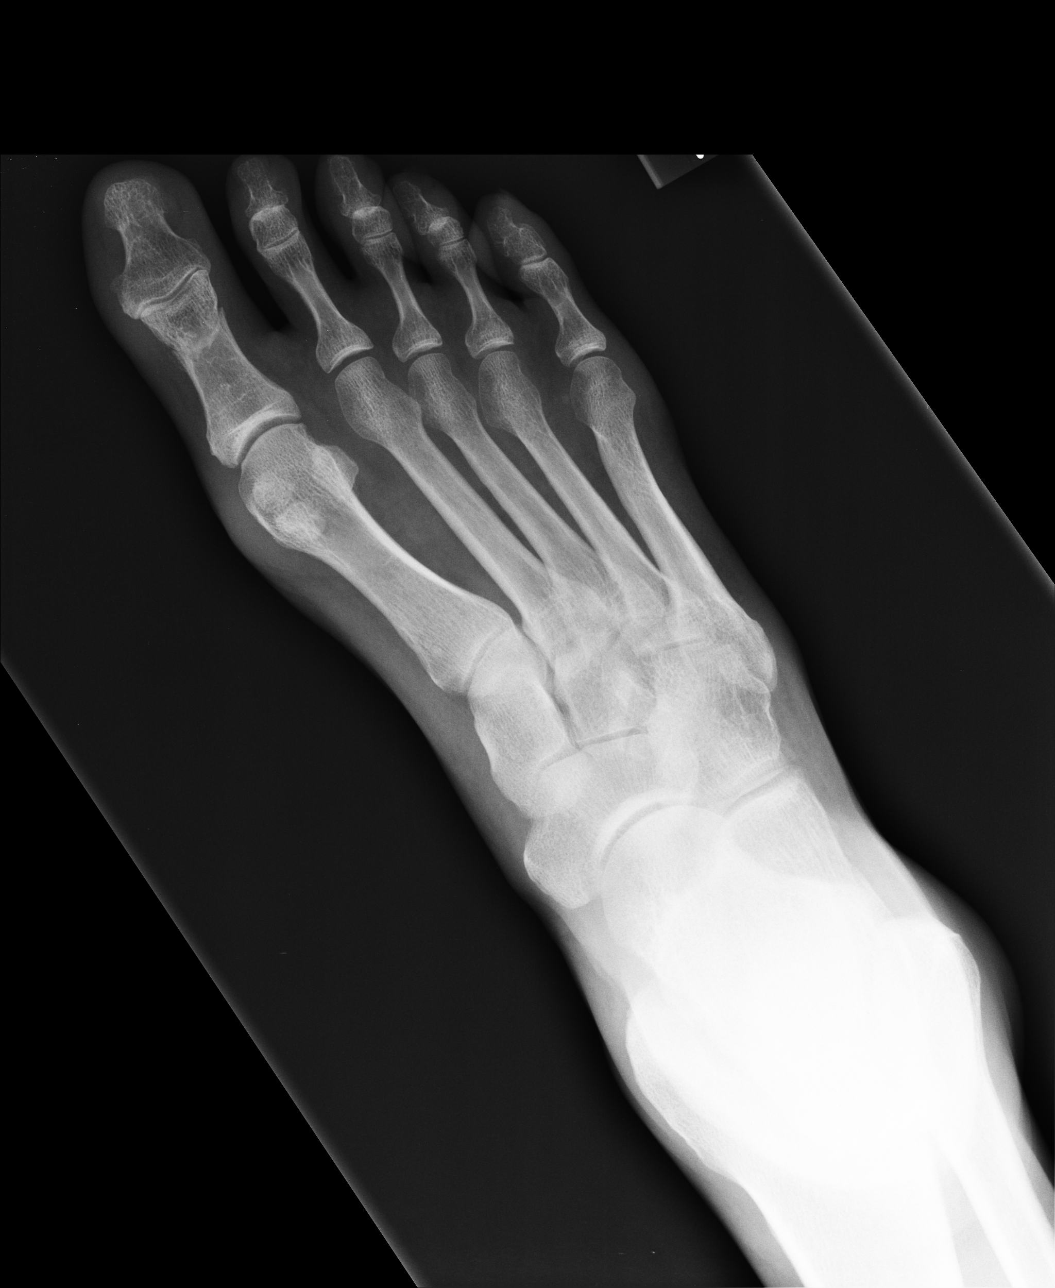
[im 2/4]
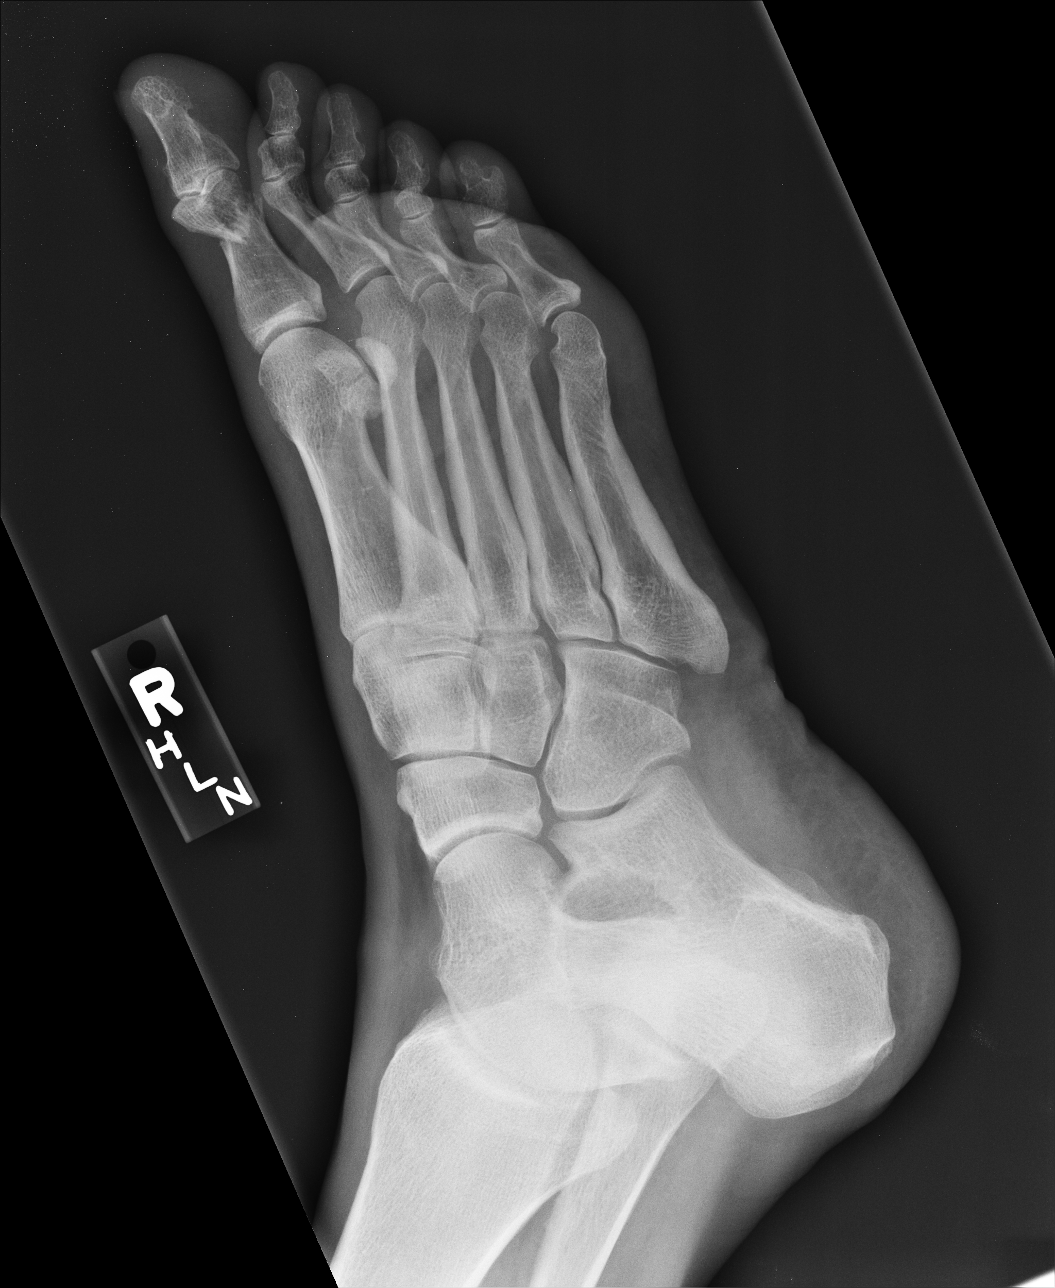
[im 3/4]
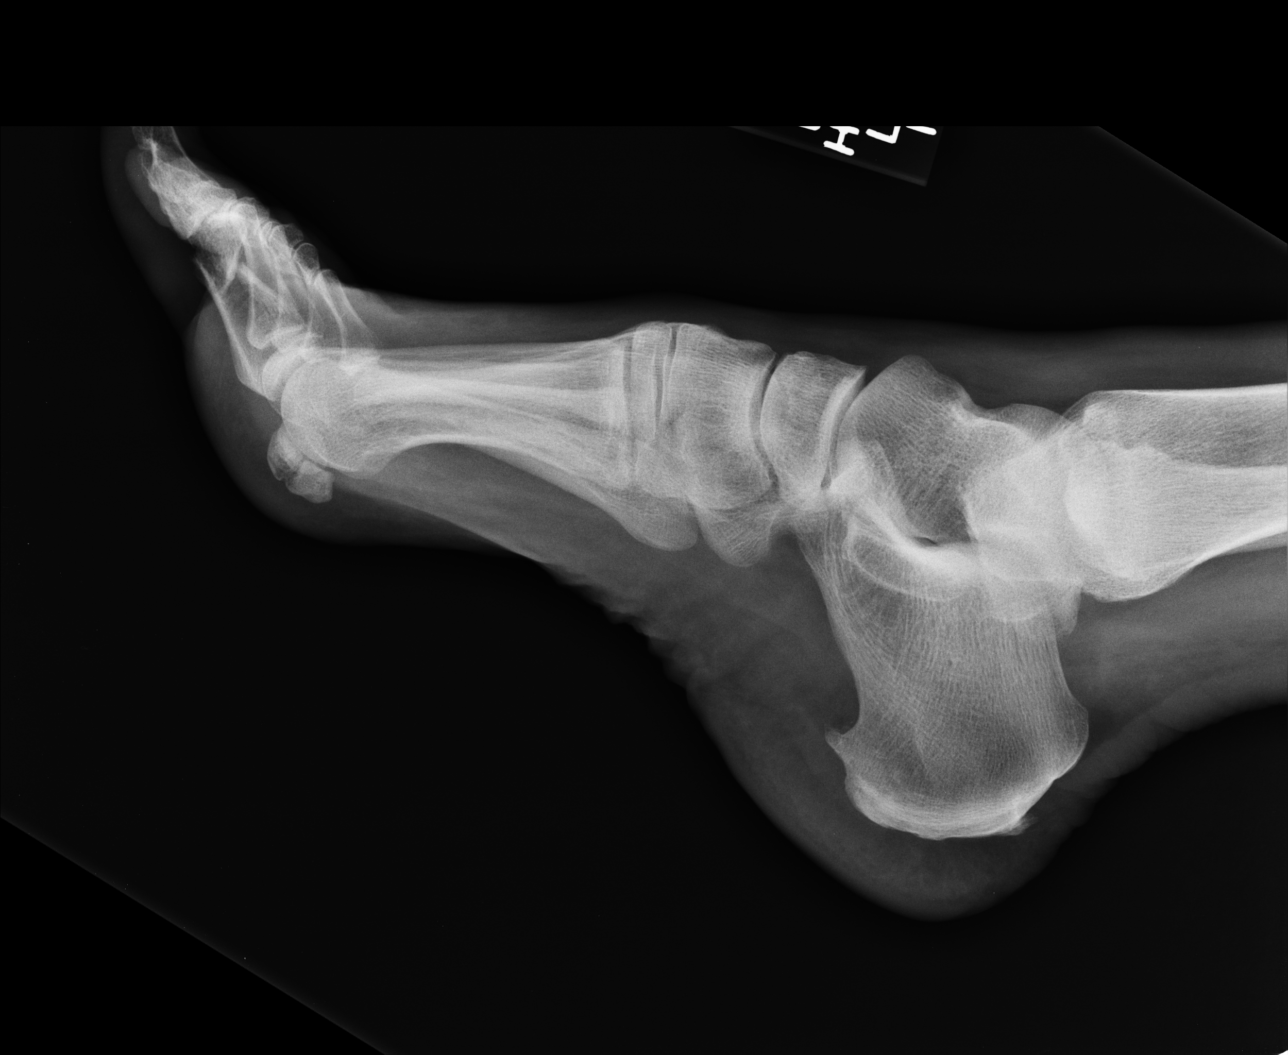
[im 4/4]
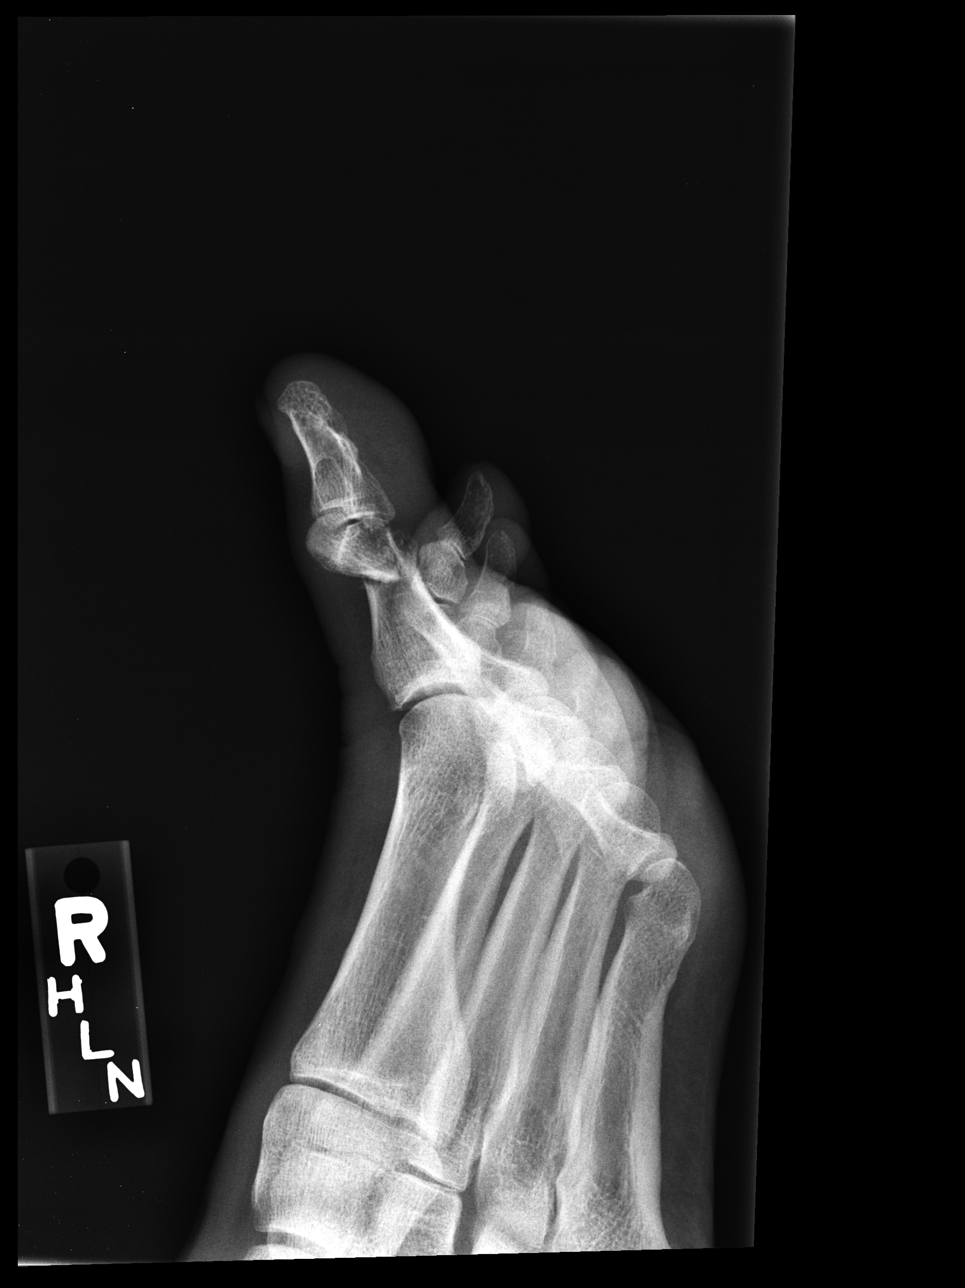

[4 of 4 positions shown; findings below may reference images not displayed]

FINDINGS: There is fracture deformity of the distal diaphysis of the right 
first proximal phalanx. No discrete callus formation. There is mild volar 
fracture apex angulation. No additional fracture. Joint spaces are preserved. 
Normal bone mineralization. Bipartite medial sesamoid. Small plantar and 
Achilles calcaneal spurs.
IMPRESSION: Acute fracture deformity of the distal diaphysis of the first proximal phalanx 
with volar fracture apex angulation.

## 2020-07-11 ENCOUNTER — Inpatient Hospital Stay
Admit: 2020-07-11 | Discharge: 2020-07-11 | Disposition: A | Discharge status: Home / Self Care | Payer: MEDICARE | Attending: Emergency Medicine

## 2020-07-11 ENCOUNTER — Emergency Department: Admit: 2020-07-11 | Payer: MEDICARE

## 2020-07-11 DIAGNOSIS — K5733 Diverticulitis of large intestine without perforation or abscess with bleeding: Secondary | ICD-10-CM

## 2020-07-11 LAB — COMPREHENSIVE METABOLIC PANEL
ALT: 38 U/L — ABNORMAL HIGH (ref 3–35)
AST: 31 U/L (ref 15–40)
Albumin/Globulin Ratio: 1.3
Albumin: 4.4 g/dL (ref 3.5–5.0)
Alkaline Phosphatase: 111 U/L — ABNORMAL HIGH (ref 35–100)
Anion Gap: 13 mmol/L
BUN: 17 MG/DL (ref 7–20)
Bun/Cre Ratio: 16 NA
CO2: 26 mmol/L (ref 20–32)
Calcium: 9 MG/DL (ref 8.8–10.5)
Chloride: 100 mmol/L (ref 100–110)
Creatinine: 1.05 MG/DL (ref 0.40–1.20)
EGFR IF NonAfrican American: >60 mL/min/{1.73_m2} (ref >60)
GFR African American: >60 mL/min/{1.73_m2} (ref >60)
Globulin: 3.3 g/dL
Glucose: 119 mg/dL — ABNORMAL HIGH (ref 75–110)
Potassium: 3.8 mmol/L (ref 3.5–5.0)
Sodium: 135 mmol/L (ref 135–145)
Total Bilirubin: 0.8 mg/dL (ref 0.10–1.20)
Total Protein: 7.7 g/dL (ref 6.2–8.0)

## 2020-07-11 LAB — CBC WITH AUTO DIFFERENTIAL
Basophils %: 1 %
Basophils Absolute: 0.1 10*3/uL (ref 0.0–0.2)
Eosinophils %: 4 %
Eosinophils Absolute: 0.4 10*3/uL (ref 0.0–0.5)
Granulocyte Absolute Count: 0.1 10*3/uL (ref 0.0–0.1)
Hematocrit: 45.9 % (ref 42.0–52.0)
Hemoglobin: 15.4 g/dL (ref 14.0–18.0)
Immature Granulocytes: 1 %
Lymphocytes %: 17 %
Lymphocytes Absolute: 1.6 10*3/uL (ref 1.0–4.5)
MCH: 30.1 PG (ref 28.0–34.0)
MCHC: 33.6 g/dL (ref 32.0–36.0)
MCV: 89.6 FL (ref 80.0–100.0)
MPV: 8.8 FL (ref 7.0–12.0)
Monocytes %: 11 %
Monocytes Absolute: 1 10*3/uL — ABNORMAL HIGH (ref 0.1–0.8)
NRBC Absolute: 0 10*3/uL
Neutrophils %: 66 %
Neutrophils Absolute: 6.4 10*3/uL (ref 1.9–7.8)
Nucleated RBCs: 0 PER 100 WBC
Platelets: 229 10*3/uL (ref 150–400)
RBC: 5.12 M/uL (ref 4.50–6.00)
RDW: 12.6 % (ref 11.5–13.5)
WBC: 9.5 10*3/uL (ref 4.8–10.8)

## 2020-07-11 LAB — URINALYSIS WITH REFLEX TO CULTURE
Bilirubin, Urine: NEGATIVE
Blood, Urine: NEGATIVE
Glucose, Ur: NEGATIVE mg/dL
Ketones, Urine: NEGATIVE mg/dL
Leukocyte Esterase, Urine: NEGATIVE
Nitrite, Urine: NEGATIVE
Protein, UA: NEGATIVE mg/dL
Specific Gravity, UA: <=1.005 (ref 1.005–1.030)
Urobilinogen, UA, POCT: 0.2 EU/dL (ref 0.1–1.0)
pH, UA: 6 NA (ref 5.0–9.0)

## 2020-07-11 LAB — LIPASE
Lipase: 48 U/L (ref 10–58)
Lipase: 48 U/L (ref 10–58)

## 2020-07-11 LAB — CBC WITH AUTOMATED DIFF
ABS. BASOPHILS: 0.1 10*3/uL (ref 0.0–0.2)
ABS. EOSINOPHILS: 0.4 10*3/uL (ref 0.0–0.5)
ABS. IMM. GRANS.: 0.1 10*3/uL (ref 0.0–0.1)
ABS. LYMPHOCYTES: 1.6 10*3/uL (ref 1.0–4.5)
ABS. MONOCYTES: 1 10*3/uL — ABNORMAL HIGH (ref 0.1–0.8)
ABS. NEUTROPHILS: 6.4 10*3/uL (ref 1.9–7.8)
ABSOLUTE NRBC: 0 10*3/uL
BASOPHILS: 1 %
EOSINOPHILS: 4 %
HCT: 45.9 % (ref 42.0–52.0)
HGB: 15.4 g/dL (ref 14.0–18.0)
IMMATURE GRANULOCYTES: 1 %
LYMPHOCYTES: 17 %
MCH: 30.1 PG (ref 28.0–34.0)
MCHC: 33.6 g/dL (ref 32.0–36.0)
MCV: 89.6 FL (ref 80.0–100.0)
MONOCYTES: 11 %
MPV: 8.8 FL (ref 7.0–12.0)
NEUTROPHILS: 66 %
NRBC: 0 PER 100 WBC
PLATELET: 229 10*3/uL (ref 150–400)
RBC: 5.12 M/uL (ref 4.50–6.00)
RDW: 12.6 % (ref 11.5–13.5)
WBC: 9.5 10*3/uL (ref 4.8–10.8)

## 2020-07-11 LAB — UA WITH REFLEX MICRO AND CULTURE
Bilirubin: NEGATIVE
Blood: NEGATIVE
Glucose: NEGATIVE mg/dL
Ketone: NEGATIVE mg/dL
Leukocyte Esterase: NEGATIVE
Nitrites: NEGATIVE
Protein: NEGATIVE mg/dL
Specific gravity: <=1.005 (ref 1.005–1.030)
Urobilinogen: 0.2 EU/dL (ref 0.1–1.0)
pH (UA): 6 (ref 5.0–9.0)

## 2020-07-11 LAB — METABOLIC PANEL, COMPREHENSIVE
A-G Ratio: 1.3
ALT (SGPT): 38 U/L — ABNORMAL HIGH (ref 3–35)
AST (SGOT): 31 U/L (ref 15–40)
Albumin: 4.4 g/dL (ref 3.5–5.0)
Alk. phosphatase: 111 U/L — ABNORMAL HIGH (ref 35–100)
Anion gap: 13 mmol/L
BUN/Creatinine ratio: 16
BUN: 17 MG/DL (ref 7–20)
Bilirubin, total: 0.8 mg/dL (ref 0.10–1.20)
CO2: 26 mmol/L (ref 20–32)
Calcium: 9 MG/DL (ref 8.8–10.5)
Chloride: 100 mmol/L (ref 100–110)
Creatinine: 1.05 MG/DL (ref 0.40–1.20)
GFR est AA: >60 mL/min/{1.73_m2} (ref >60)
GFR est non-AA: >60 mL/min/{1.73_m2} (ref >60)
Globulin: 3.3 g/dL
Glucose: 119 mg/dL — ABNORMAL HIGH (ref 75–110)
Potassium: 3.8 mmol/L (ref 3.5–5.0)
Protein, total: 7.7 g/dL (ref 6.2–8.0)
Sodium: 135 mmol/L (ref 135–145)

## 2020-07-11 MED ORDER — AMOXICILLIN CLAVULANATE 875 MG-125 MG TAB
875-125 mg | ORAL | Status: AC
Start: 2020-07-11 — End: 2020-07-11
  Administered 2020-07-11: 20:00:00 via ORAL

## 2020-07-11 MED ORDER — IOHEXOL 300 MG/ML IV SOLN
300 mg iodine/mL | Freq: Once | INTRAVENOUS | Status: AC
Start: 2020-07-11 — End: 2020-07-11
  Administered 2020-07-11: 19:00:00 via INTRAVENOUS

## 2020-07-11 MED ORDER — SODIUM CHLORIDE 0.9 % IJ SYRG
INTRAMUSCULAR | Status: DC | PRN
Start: 2020-07-11 — End: 2020-07-11

## 2020-07-11 MED ORDER — SODIUM CHLORIDE 0.9 % IJ SYRG
Freq: Three times a day (TID) | INTRAMUSCULAR | Status: DC
Start: 2020-07-11 — End: 2020-07-11

## 2020-07-11 MED ORDER — AMOXICILLIN CLAVULANATE 875 MG-125 MG TAB
875-125 mg | ORAL_TABLET | Freq: Two times a day (BID) | ORAL | 0 refills | Status: AC
Start: 2020-07-11 — End: 2020-07-18

## 2020-07-11 MED FILL — BD POSIFLUSH NORMAL SALINE 0.9 % INJECTION SYRINGE: INTRAMUSCULAR | Qty: 10

## 2020-07-11 MED FILL — AMOXICILLIN CLAVULANATE 875 MG-125 MG TAB: 875-125 mg | ORAL | Qty: 1

## 2020-07-11 MED FILL — OMNIPAQUE 300 MG IODINE/ML INTRAVENOUS SOLUTION: 300 mg iodine/mL | INTRAVENOUS | Qty: 80

## 2020-07-11 NOTE — ED Notes (Signed)
Lower abd pain for 1 week , some loose stools , c/o question bloody stools and black

## 2020-07-11 NOTE — ED Notes (Signed)
Patient has had abd. Cramping for about a week and more frequent and loose BM's.  History of diverticulitis.  Thought his MD's were darker, questioning blood in it.

## 2020-07-11 NOTE — ED Provider Notes (Signed)
Chief complaint:  Abdominal discomfort  History chief complaint:  64 year old male with a past medical history of diverticulosis coli, hypertension and diabetes mellitus arrives here with increasing cramps changes in bowel habits and left lower quadrant and suprapubic abdominal discomfort culminating in a bowel movement this morning which appear to have blood mixed in with it.  No gross rectal bleeding passing of clots no passing a mucus no dysuria hematuria flank pain.  He denies any abdominal surgeries outside of a herniorrhaphy.  Pain is not exacerbated with position changes.  He has a crampy like discomfort associated with some changes in bowel consistency and location in the left lower quadrant and suprapubic region.  The discomfort does not radiate.           Past Medical History:   Diagnosis Date   ??? Diabetes ([place])    ??? Diverticulitis    ??? Hypertension        History reviewed. No pertinent surgical history.      History reviewed. No pertinent family history.    Social History     Socioeconomic History   ??? Marital status: MARRIED     Spouse name: Not on file   ??? Number of children: Not on file   ??? Years of education: Not on file   ??? Highest education level: Not on file   Occupational History   ??? Not on file   Tobacco Use   ??? Smoking status: Not on file   ??? Smokeless tobacco: Not on file   Substance and Sexual Activity   ??? Alcohol use: Not on file   ??? Drug use: Not on file   ??? Sexual activity: Not on file   Other Topics Concern   ??? Not on file   Social History Narrative   ??? Not on file     Social Determinants of Health     Financial Resource Strain:    ??? Difficulty of Paying Living Expenses: Not on file   Food Insecurity:    ??? Worried About Running Out of Food in the Last Year: Not on file   ??? Ran Out of Food in the Last Year: Not on file   Transportation Needs:    ??? Lack of Transportation (Medical): Not on file   ??? Lack of Transportation (Non-Medical): Not on file   Physical Activity:    ??? Days of Exercise per  Week: Not on file   ??? Minutes of Exercise per Session: Not on file   Stress:    ??? Feeling of Stress : Not on file   Social Connections:    ??? Frequency of Communication with Friends and Family: Not on file   ??? Frequency of Social Gatherings with Friends and Family: Not on file   ??? Attends Religious Services: Not on file   ??? Active Member of Clubs or Organizations: Not on file   ??? Attends Archivist Meetings: Not on file   ??? Marital Status: Not on file   Intimate Partner Violence:    ??? Fear of Current or Ex-Partner: Not on file   ??? Emotionally Abused: Not on file   ??? Physically Abused: Not on file   ??? Sexually Abused: Not on file   Housing Stability:    ??? Unable to Pay for Housing in the Last Year: Not on file   ??? Number of Places Lived in the Last Year: Not on file   ??? Unstable Housing in the Last Year: Not on  file         ALLERGIES: Patient has no known allergies.    Review of Systems   Constitutional: Negative for chills, diaphoresis, fatigue and fever.   Respiratory: Negative.    Cardiovascular: Negative.    Gastrointestinal: Positive for abdominal pain and blood in stool. Negative for rectal pain.   Genitourinary: Negative.    Musculoskeletal: Negative.    Skin: Negative.    Allergic/Immunologic: Negative for immunocompromised state.   Neurological: Negative.    Hematological: Does not bruise/bleed easily.       Vitals:    07/11/20 1248   BP: (!) 157/92   Pulse: 90   Resp: 18   Temp: 98.1 ??F (36.7 ??C)   SpO2: 98%   Weight: 95.3 kg (210 lb)   Height: '6\' 3"'  (1.905 m)            Physical Exam  Vitals and nursing note reviewed. Exam conducted with a chaperone present.   Constitutional:       Appearance: He is well-developed.   HENT:      Head: Normocephalic and atraumatic.   Eyes:      General: No scleral icterus.  Cardiovascular:      Rate and Rhythm: Normal rate and regular rhythm.      Heart sounds: No murmur heard.  No friction rub.   Pulmonary:      Effort: Pulmonary effort is normal. No respiratory  distress.   Abdominal:      General: Abdomen is flat. Bowel sounds are decreased.      Palpations: Abdomen is soft.      Tenderness: There is abdominal tenderness in the suprapubic area and left lower quadrant.   Skin:     General: Skin is warm and dry.      Capillary Refill: Capillary refill takes less than 2 seconds.   Neurological:      General: No focal deficit present.      Mental Status: He is alert and oriented to person, place, and time.   Psychiatric:         Mood and Affect: Mood normal.         Behavior: Behavior normal.          MDM  Number of Diagnoses or Management Options  Diagnosis management comments: Differential diagnosis considered with the patient's chief complaint history chief complaint past medical history physical exam findings.  A workup was instituted department to assess the patient for left lower quadrant suprapubic pain this will include laboratory studies to assess for hematologic metabolic derangement a CT scan of the abdomen and pelvis with IV contrast to assess for acute intra-abdominal inflammatory process.    ED Course as of 07/11/20 1434   Sat Jul 11, 2020   1406 CT images of the abdomen and pelvis reviewed.  Awaiting Radiology interpretation.  Patient is resting comfortably [ST]   1427 Patient resting comfortably we discussed the results of the workup plan of care developed. [ST]      ED Course User Index  [ST] Teresa Pelton A, DO       Procedures      NIH Stroke Scale        Vital Signs for this visit:  Patient Vitals for the past 12 hrs:   Temp Pulse Resp BP SpO2   07/11/20 1414 ??? ??? ??? ??? 98 %   07/11/20 1413 ??? ??? ??? (!) 168/106 ???   07/11/20 1248 98.1 ??F (36.7 ??C) 90 18 (!)  157/92 98 %       Lab findings during this visit (only abnormal values will be noted, if no value noted then the result was normal range):  Labs Reviewed   CBC WITH AUTOMATED DIFF - Abnormal; Notable for the following components:       Result Value    ABS. MONOCYTES 1.0 (*)     All other components within  normal limits   METABOLIC PANEL, COMPREHENSIVE - Abnormal; Notable for the following components:    Glucose 119 (*)     ALT (SGPT) 38 (*)     Alk. phosphatase 111 (*)     All other components within normal limits   LIPASE   UA WITH REFLEX MICRO AND CULTURE       Radiology studies during this visit  CT ABD PELV W CONT    Result Date: 07/11/2020  No definite etiology of patient's symptoms is identified. No acute findings. Colonic diverticulosis without evidence of diverticulitis. No definite acute bowel abnormality is appreciated.  Normal appendix is identified.  There is prominent submucosal fat deposition seen in the proximal colon, which may be a normal finding, although this can also be seen in the setting of chronic inflammation. Small to moderate right-sided fat containing inguinal hernia without bowel involvement.  Changes of prior left inguinal hernia repair with mesh and retainers are noted.  Several small bowel loops appear to be intimate with the site of prior left inguinal hernia repair without any evidence of obstruction or dilation, although subtle underlying adhesions cannot be excluded. There is also nonspecific finding of mild misty mesentery with associated lymph node prominence.  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  No adenopathy by size criteria.       Medications given in the ED:  Medications   sodium chloride (NS) flush 5-10 mL (has no administration in time range)   sodium chloride (NS) flush 5-10 mL (has no administration in time range)   amoxicillin-clavulanate (AUGMENTIN) 875-125 mg per tablet 1 Tablet (has no administration in time range)   iohexoL (OMNIPAQUE) 300 mg iodine/mL contrast injection 80 mL (80 mL IntraVENous Given 07/11/20 1352)       Diagnosis:    ICD-10-CM ICD-9-CM   1. Diverticulitis of large intestine without perforation or abscess with bleeding  K57.33 562.13       Condition at disposition:  Condition stable    Disposition:  Discharged    Discharge  prescriptions and/or changes if applicable:  Current Discharge Medication List      START taking these medications    Details   amoxicillin-clavulanate (Augmentin) 875-125 mg per tablet Take 1 Tablet by mouth two (2) times a day for 7 days.  Qty: 14 Tablet, Refills: 0  Start date: 07/11/2020, End date: 07/18/2020             Follow-up:  Cyril Mourning, MD  [address]  [place] [address]  [phone]    In 1 week  If symptoms worsen      Please note that portions of this document were created using the M*Modal Fluency Direct dictation system.  Any inconsistencies or typographical errors may be the result of mis-transcription that persist in spite of proof-reading and should be addressed with the document creator.

## 2020-07-16 ENCOUNTER — Emergency Department: Admit: 2020-07-16 | Payer: MEDICARE

## 2020-07-16 ENCOUNTER — Inpatient Hospital Stay
Admit: 2020-07-16 | Discharge: 2020-07-16 | Disposition: A | Discharge status: Home / Self Care | Payer: MEDICARE | Attending: Emergency Medicine

## 2020-07-16 DIAGNOSIS — R1013 Epigastric pain: Secondary | ICD-10-CM

## 2020-07-16 LAB — COMPREHENSIVE METABOLIC PANEL
ALT: 31 U/L (ref 3–35)
AST: 26 U/L (ref 15–40)
Albumin/Globulin Ratio: 1
Albumin: 3.8 g/dL (ref 3.5–5.0)
Alkaline Phosphatase: 126 U/L — ABNORMAL HIGH (ref 35–100)
Anion Gap: 15 mmol/L
BUN: 11 MG/DL (ref 7–20)
Bun/Cre Ratio: 10 NA
CO2: 24 mmol/L (ref 20–32)
Calcium: 8.6 MG/DL — ABNORMAL LOW (ref 8.8–10.5)
Chloride: 99 mmol/L — ABNORMAL LOW (ref 100–110)
Creatinine: 1.05 MG/DL (ref 0.40–1.20)
EGFR IF NonAfrican American: >60 mL/min/{1.73_m2} (ref >60)
GFR African American: >60 mL/min/{1.73_m2} (ref >60)
Globulin: 3.7 g/dL
Glucose: 172 mg/dL — ABNORMAL HIGH (ref 75–110)
Potassium: 3.8 mmol/L (ref 3.5–5.0)
Sodium: 134 mmol/L — ABNORMAL LOW (ref 135–145)
Total Bilirubin: 1.1 mg/dL (ref 0.10–1.20)
Total Protein: 7.5 g/dL (ref 6.2–8.0)

## 2020-07-16 LAB — CBC WITH AUTO DIFFERENTIAL
Basophils %: 1 %
Basophils Absolute: 0.1 10*3/uL (ref 0.0–0.2)
Eosinophils %: 6 %
Eosinophils Absolute: 0.7 10*3/uL — ABNORMAL HIGH (ref 0.0–0.5)
Granulocyte Absolute Count: 0.1 10*3/uL (ref 0.0–0.1)
Hematocrit: 47.7 % (ref 42.0–52.0)
Hemoglobin: 16.2 g/dL (ref 14.0–18.0)
Immature Granulocytes: 0 %
Lymphocytes %: 10 %
Lymphocytes Absolute: 1.1 10*3/uL (ref 1.0–4.5)
MCH: 30.3 PG (ref 28.0–34.0)
MCHC: 34 g/dL (ref 32.0–36.0)
MCV: 89.2 FL (ref 80.0–100.0)
MPV: 9 FL (ref 7.0–12.0)
Monocytes %: 10 %
Monocytes Absolute: 1.1 10*3/uL — ABNORMAL HIGH (ref 0.1–0.8)
NRBC Absolute: 0 10*3/uL
Neutrophils %: 73 %
Neutrophils Absolute: 8.4 10*3/uL — ABNORMAL HIGH (ref 1.9–7.8)
Nucleated RBCs: 0 PER 100 WBC
Platelets: 214 10*3/uL (ref 150–400)
RBC: 5.35 M/uL (ref 4.50–6.00)
RDW: 12.8 % (ref 11.5–13.5)
WBC: 11.3 10*3/uL — ABNORMAL HIGH (ref 4.8–10.8)

## 2020-07-16 LAB — URINALYSIS WITH REFLEX TO CULTURE
Bilirubin, Urine: NEGATIVE
Blood, Urine: NEGATIVE
Glucose, Ur: NEGATIVE mg/dL
Leukocyte Esterase, Urine: NEGATIVE
Nitrite, Urine: NEGATIVE
Specific Gravity, UA: >=1.03 (ref 1.005–1.030)
Urobilinogen, UA, POCT: 0.2 EU/dL (ref 0.1–1.0)
pH, UA: 5 NA (ref 5.0–9.0)

## 2020-07-16 LAB — MAGNESIUM
Magnesium: 2 mg/dL (ref 1.7–2.5)
Magnesium: 2 mg/dL (ref 1.7–2.5)

## 2020-07-16 LAB — C. DIFFICILE TOXIN BY PCR
027/NAP1 B1: NEGATIVE — AB
Cldnap: NEGATIVE — AB
Toxigenic C. Diff.: POSITIVE — AB
Toxigenic C. diff.: POSITIVE — AB

## 2020-07-16 LAB — LIPASE
Lipase: 33 U/L (ref 10–58)
Lipase: 33 U/L (ref 10–58)

## 2020-07-16 LAB — CBC WITH AUTOMATED DIFF
ABS. BASOPHILS: 0.1 10*3/uL (ref 0.0–0.2)
ABS. EOSINOPHILS: 0.7 10*3/uL — ABNORMAL HIGH (ref 0.0–0.5)
ABS. IMM. GRANS.: 0.1 10*3/uL (ref 0.0–0.1)
ABS. LYMPHOCYTES: 1.1 10*3/uL (ref 1.0–4.5)
ABS. MONOCYTES: 1.1 10*3/uL — ABNORMAL HIGH (ref 0.1–0.8)
ABS. NEUTROPHILS: 8.4 10*3/uL — ABNORMAL HIGH (ref 1.9–7.8)
ABSOLUTE NRBC: 0 10*3/uL
BASOPHILS: 1 %
EOSINOPHILS: 6 %
HCT: 47.7 % (ref 42.0–52.0)
HGB: 16.2 g/dL (ref 14.0–18.0)
IMMATURE GRANULOCYTES: 0 %
LYMPHOCYTES: 10 %
MCH: 30.3 PG (ref 28.0–34.0)
MCHC: 34 g/dL (ref 32.0–36.0)
MCV: 89.2 FL (ref 80.0–100.0)
MONOCYTES: 10 %
MPV: 9 FL (ref 7.0–12.0)
NEUTROPHILS: 73 %
NRBC: 0 PER 100 WBC
PLATELET: 214 10*3/uL (ref 150–400)
RBC: 5.35 M/uL (ref 4.50–6.00)
RDW: 12.8 % (ref 11.5–13.5)
WBC: 11.3 10*3/uL — ABNORMAL HIGH (ref 4.8–10.8)

## 2020-07-16 LAB — METABOLIC PANEL, COMPREHENSIVE
A-G Ratio: 1
ALT (SGPT): 31 U/L (ref 3–35)
AST (SGOT): 26 U/L (ref 15–40)
Albumin: 3.8 g/dL (ref 3.5–5.0)
Alk. phosphatase: 126 U/L — ABNORMAL HIGH (ref 35–100)
Anion gap: 15 mmol/L
BUN/Creatinine ratio: 10
BUN: 11 MG/DL (ref 7–20)
Bilirubin, total: 1.1 mg/dL (ref 0.10–1.20)
CO2: 24 mmol/L (ref 20–32)
Calcium: 8.6 MG/DL — ABNORMAL LOW (ref 8.8–10.5)
Chloride: 99 mmol/L — ABNORMAL LOW (ref 100–110)
Creatinine: 1.05 MG/DL (ref 0.40–1.20)
GFR est AA: >60 mL/min/{1.73_m2} (ref >60)
GFR est non-AA: >60 mL/min/{1.73_m2} (ref >60)
Globulin: 3.7 g/dL
Glucose: 172 mg/dL — ABNORMAL HIGH (ref 75–110)
Potassium: 3.8 mmol/L (ref 3.5–5.0)
Protein, total: 7.5 g/dL (ref 6.2–8.0)
Sodium: 134 mmol/L — ABNORMAL LOW (ref 135–145)

## 2020-07-16 LAB — UA WITH REFLEX MICRO AND CULTURE
Bilirubin: NEGATIVE
Blood: NEGATIVE
Glucose: NEGATIVE mg/dL
Leukocyte Esterase: NEGATIVE
Nitrites: NEGATIVE
Specific gravity: >=1.03 (ref 1.005–1.030)
Urobilinogen: 0.2 EU/dL (ref 0.1–1.0)
pH (UA): 5 (ref 5.0–9.0)

## 2020-07-16 LAB — C. DIFFICILE AG + TOXIN A/B
C DIFFICILE TOXINS A+B, EIA: POSITIVE — AB
C. difficile Ag: POSITIVE — AB

## 2020-07-16 MED ORDER — DICYCLOMINE 10 MG CAP
10 mg | ORAL | Status: AC
Start: 2020-07-16 — End: 2020-07-16
  Administered 2020-07-16: 13:00:00 via ORAL

## 2020-07-16 MED ORDER — VANCOMYCIN 125 MG CAP
125 mg | ORAL | Status: AC
Start: 2020-07-16 — End: 2020-07-16
  Administered 2020-07-16: 18:00:00 via ORAL

## 2020-07-16 MED ORDER — DICYCLOMINE 10 MG CAP
10 mg | ORAL_CAPSULE | Freq: Four times a day (QID) | ORAL | 0 refills | Status: AC | PRN
Start: 2020-07-16 — End: 2020-07-21

## 2020-07-16 MED ORDER — SODIUM CHLORIDE 0.9 % IJ SYRG
Freq: Once | INTRAMUSCULAR | Status: DC
Start: 2020-07-16 — End: 2020-07-16

## 2020-07-16 MED ORDER — CIPROFLOXACIN 500 MG TAB
500 mg | ORAL_TABLET | Freq: Two times a day (BID) | ORAL | 0 refills | Status: AC
Start: 2020-07-16 — End: 2020-07-23

## 2020-07-16 MED ORDER — METRONIDAZOLE 250 MG TAB
250 mg | ORAL | Status: AC
Start: 2020-07-16 — End: 2020-07-16
  Administered 2020-07-16: 18:00:00 via ORAL

## 2020-07-16 MED ORDER — SODIUM CHLORIDE 0.9% BOLUS IV
0.9 % | Freq: Once | INTRAVENOUS | Status: AC
Start: 2020-07-16 — End: 2020-07-16
  Administered 2020-07-16: 13:00:00 via INTRAVENOUS

## 2020-07-16 MED ORDER — METRONIDAZOLE 500 MG TAB
500 mg | ORAL_TABLET | Freq: Three times a day (TID) | ORAL | 0 refills | Status: AC
Start: 2020-07-16 — End: 2020-07-23

## 2020-07-16 MED ORDER — VANCOMYCIN 125 MG CAP
125 mg | ORAL_CAPSULE | Freq: Four times a day (QID) | ORAL | 0 refills | Status: AC
Start: 2020-07-16 — End: 2020-07-26

## 2020-07-16 MED ORDER — CIPROFLOXACIN 250 MG TAB
250 mg | ORAL | Status: AC
Start: 2020-07-16 — End: 2020-07-16
  Administered 2020-07-16: 18:00:00 via ORAL

## 2020-07-16 MED ORDER — IOHEXOL 300 MG/ML IV SOLN
300 mg iodine/mL | Freq: Once | INTRAVENOUS | Status: AC
Start: 2020-07-16 — End: 2020-07-16
  Administered 2020-07-16: 15:00:00 via INTRAVENOUS

## 2020-07-16 MED FILL — CIPROFLOXACIN 250 MG TAB: 250 mg | ORAL | Qty: 2

## 2020-07-16 MED FILL — DICYCLOMINE 10 MG CAP: 10 mg | ORAL | Qty: 1

## 2020-07-16 MED FILL — OMNIPAQUE 300 MG IODINE/ML INTRAVENOUS SOLUTION: 300 mg iodine/mL | INTRAVENOUS | Qty: 80

## 2020-07-16 MED FILL — VANCOMYCIN 125 MG CAP: 125 mg | ORAL | Qty: 1

## 2020-07-16 MED FILL — METRONIDAZOLE 250 MG TAB: 250 mg | ORAL | Qty: 2

## 2020-07-16 MED FILL — BD POSIFLUSH NORMAL SALINE 0.9 % INJECTION SYRINGE: INTRAMUSCULAR | Qty: 10

## 2020-07-16 MED FILL — SODIUM CHLORIDE 0.9 % IV: INTRAVENOUS | Qty: 1000

## 2020-07-16 NOTE — ED Notes (Signed)
ABD pain/diarrhea x 'a couple of weeks.' Workup hereon 11/28 for diverticulitis.

## 2020-07-16 NOTE — ED Notes (Signed)
Pt C/O increasing abdominal pain since 07/12/20. Pt reports being treated here on 07/12/20 for diverticulitis. Discharged with course of Augmentin. Pt reports having completed this with no improvement and worsening of symptoms. Initial symptoms started approximately 1.5 weeks ago. Reports + for diarrhea (15x since yesterday), nausea, and weight loss. Denies vomiting, fever, chills. Pt also reports having been treated with an additional course of Augmentin prior to coming here on the 28th, prescribed by son-in law, for a cough. Denies cough, SOB, or respiratory symptoms today. Last food intake at 5 pm on 12/1, last fluid intake the same. Denies urinary symptoms.   Pt is A&Ox4. Wife present in the room. Speech is clear and articulate. Respirations even and non-labored. No congestion, SOB observed. Lung sounds clear bilaterally. Skin is warm and dry. No swelling noted in LE. Abdominal tenderness with palpation noted in mid, lower abdomen.   Pt provided sample of urine, instructed on stool collection. Verbalized understanding.

## 2020-07-16 NOTE — ED Notes (Signed)
Pt continues to rest in comfortably on stretcher with HOB elevated. Wife remains present. Pt reports improvement in pain since medications were provided according to the [facility]. Has provided stool sample.   Dr. Renold Don in to discuss recent results. Pt opting for repeat CT.   Denies additional needs.

## 2020-07-16 NOTE — ED Provider Notes (Signed)
64 y.o. male here for eval of abdominal pain and diarrhea.  He says that he has had symptoms for about 2 weeks. About 2 weeks before the abdominal pain started he had some congestion.  He says that he hasn't contacted his primary care physician but had his son in law write him a prescription for 2 weeks of Augmentin.  It is not clear what this was treating other than patient's reported congestion.  He did get tested for covid and was negative.  Shortly after finishing the 2 weeks of augmentin he started with the lower abdominal pain and diarrhea.  The diarrhea has progressed to the point of 12-15 episodes yesterday.  The abdominal pain is suprapubic and periumbilical.  He says that it is constant but sometimes is worse than other times.  He can't identify anything that makes the pain better or worse.  No fevers.  No dysuria.  He had some nausea last night but no vomiting.  He has a h/o diverticulitis but was here last Saturday and had a CT scan which showed diverticulosis but no diverticulitis.  Given symptoms, he was given another 7 days of augmentin.  He says that he has no appetite and is losing weight.  He still has not attempted to contact his PCP regarding these symptoms but does have a routine visit scheduled for next week.            Past Medical History:   Diagnosis Date   ??? Diabetes (HCC)    ??? Diverticulitis    ??? Hypertension        History reviewed. No pertinent surgical history.      History reviewed. No pertinent family history.    Social History     Socioeconomic History   ??? Marital status: MARRIED     Spouse name: Not on file   ??? Number of children: Not on file   ??? Years of education: Not on file   ??? Highest education level: Not on file   Occupational History   ??? Not on file   Tobacco Use   ??? Smoking status: Never Smoker   ??? Smokeless tobacco: Never Used   Substance and Sexual Activity   ??? Alcohol use: Yes     Alcohol/week: 14.0 standard drinks     Types: 14 Shots of liquor per week   ??? Drug use:  Never   ??? Sexual activity: Not on file   Other Topics Concern   ??? Not on file   Social History Narrative   ??? Not on file     Social Determinants of Health     Financial Resource Strain:    ??? Difficulty of Paying Living Expenses: Not on file   Food Insecurity:    ??? Worried About Running Out of Food in the Last Year: Not on file   ??? Ran Out of Food in the Last Year: Not on file   Transportation Needs:    ??? Lack of Transportation (Medical): Not on file   ??? Lack of Transportation (Non-Medical): Not on file   Physical Activity:    ??? Days of Exercise per Week: Not on file   ??? Minutes of Exercise per Session: Not on file   Stress:    ??? Feeling of Stress : Not on file   Social Connections:    ??? Frequency of Communication with Friends and Family: Not on file   ??? Frequency of Social Gatherings with Friends and Family: Not on file   ???  Attends Religious Services: Not on file   ??? Active Member of Clubs or Organizations: Not on file   ??? Attends Banker Meetings: Not on file   ??? Marital Status: Not on file   Intimate Partner Violence:    ??? Fear of Current or Ex-Partner: Not on file   ??? Emotionally Abused: Not on file   ??? Physically Abused: Not on file   ??? Sexually Abused: Not on file   Housing Stability:    ??? Unable to Pay for Housing in the Last Year: Not on file   ??? Number of Places Lived in the Last Year: Not on file   ??? Unstable Housing in the Last Year: Not on file         ALLERGIES: Patient has no known allergies.    Review of Systems   Constitutional: Negative for chills and fever.   HENT: Negative for drooling.    Eyes: Negative for photophobia.   Respiratory: Negative for cough and shortness of breath.    Cardiovascular: Negative for chest pain and leg swelling.   Gastrointestinal: Positive for abdominal pain, diarrhea and nausea. Negative for vomiting.   Genitourinary: Negative for dysuria.   Musculoskeletal: Negative for neck stiffness.   Skin: Negative for pallor.   Neurological: Negative for facial  asymmetry.   Psychiatric/Behavioral: Negative for agitation.       Vitals:    07/16/20 0720 07/16/20 0743   BP: (!) 148/94    Pulse: (!) 113    Resp: 17    Temp: 98.1 ??F (36.7 ??C)    SpO2: 98% 98%   Weight: 95.3 kg (210 lb)    Height: 6\' 3"  (1.905 m)             Physical Exam  Vitals and nursing note reviewed.   Constitutional:       General: He is not in acute distress.     Appearance: He is well-developed. He is not ill-appearing.   HENT:      Head: Normocephalic.   Cardiovascular:      Rate and Rhythm: Regular rhythm. Tachycardia present.      Heart sounds: No murmur heard.  No friction rub. No gallop.    Pulmonary:      Effort: Pulmonary effort is normal. No respiratory distress.      Breath sounds: Normal breath sounds. No stridor. No wheezing, rhonchi or rales.   Chest:      Chest wall: No tenderness.   Abdominal:      Palpations: Abdomen is soft.      Tenderness: There is abdominal tenderness in the periumbilical area and suprapubic area. There is no guarding or rebound.   Skin:     General: Skin is warm and dry.   Neurological:      Mental Status: He is alert and oriented to person, place, and time.   Psychiatric:         Mood and Affect: Mood normal. Mood is not anxious or depressed.         Behavior: Behavior normal.          MDM  Number of Diagnoses or Management Options  Abdominal pain, epigastric  C. difficile diarrhea  Diarrhea, unspecified type  Diverticulitis  Diagnosis management comments: Patient with ongoing abdominal pain and worsening diarrhea.  No fevers.  Given that he has been on augmentin for 3 out of the past 4 weeks this could be contributing to the diarrhea.  Will also check  for c-diff.  Labs and urine ordered.      9:32 AM  Patient is feeling better.  He has provided stool sample that is being sent to lab.  WBC only minimally elevated.  Discussed risks/benefits of repeat CT scan.  Patient's wife feels strongly that he needs to have the CT repeated and patient does not disagree.  Will  order CT.     11:41 AM  C.diff is positive and CT shows diverticulitis.  Awaiting call back from GI to discuss management.  Discussed judicious use of antibiotics only when documented necessity to try to avoid C. Diff.     12:46 PM  Discussed with Dr. Monte Fantasia (GI) who agrees with plan for PO cipro/flagyl/vanco and close outpatient follow up (already has visit scheduled with PCP for next Wednesday).  Will give rx for 7 days and if needs the additional three for the cipro/flagyl PCP can write for this.  Will give 10 days of vanco.  Return precautions reviewed.  Will give bentyl for pain.  Patient does not feel like he needs anything for nausea.  Also discussed how to disinfect the house with the patient and his wife.  All questions answered.        Amount and/or Complexity of Data Reviewed  Decide to obtain previous medical records or to obtain history from someone other than the patient: yes           Procedures      Vital Signs for this visit:  Patient Vitals for the past 12 hrs:   Temp Pulse Resp BP SpO2   07/16/20 0743 ??? ??? ??? ??? 98 %   07/16/20 0720 98.1 ??F (36.7 ??C) (!) 113 17 (!) 148/94 98 %       Lab findings during this visit (only abnormal values will be noted, if no value noted then the result was normal range):  Labs Reviewed   C. DIFFICILE TOXIN BY PCR   UA WITH REFLEX MICRO AND CULTURE   CBC WITH AUTOMATED DIFF   METABOLIC PANEL, COMPREHENSIVE   LIPASE   MAGNESIUM       Radiology studies during this visit  No results found.    Medications given in the ED:  Medications   sodium chloride (NS) flush 5-10 mL (has no administration in time range)   sodium chloride 0.9 % bolus infusion 1,000 mL (has no administration in time range)   dicyclomine (BENTYL) capsule 10 mg (has no administration in time range)       Diagnosis:  No diagnosis found.    Condition at disposition:  Condition stable    Disposition:      Discharge prescriptions and/or changes if applicable:  Current Discharge Medication List           Follow-up:  No follow-up provider specified.    Please note that portions of this document were created using the M*Modal Fluency Direct dictation system.  Any inconsistencies or typographical errors may be the result of mis-transcription that persist in spite of proof-reading and should be addressed with the document creator.

## 2020-10-26 IMAGING — MR MRI BRAIN WITHOUT CONTRAST
10 series · 39 of 48 positions shown · non-contrast
Comparison: None

MRI BRAIN WITHOUT CONTRAST, 10/26/2020 [DATE]: 
CLINICAL INDICATION: Right neck and jaw tingling x2 months, now on left side
TECHNIQUE: Axial T1, Axial T2, Axial FLAIR, Diffusion weighted images, Sagittal 
T1, and Coronal T2 MR images of the brain were performed without intravenous 
contrast enhancement. The patient's eGFR was calculated to be 84 using the 
i-STAT device.

[Series 103: patient aligned mpr · axial · 15.6mm · 0.98mm/px · z∈[-57,+162]mm · 8 of 52 slices shown]
[im 1/52]
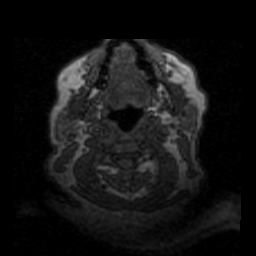
[im 8/52]
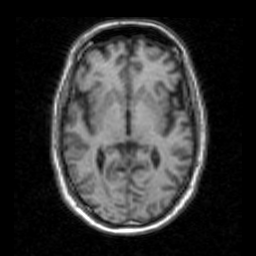
[im 15/52]
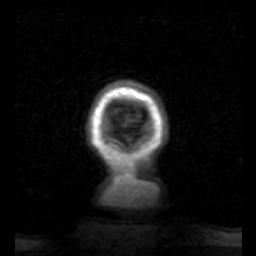
[im 22/52]
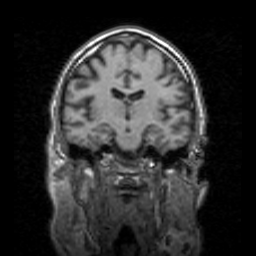
[im 30/52]
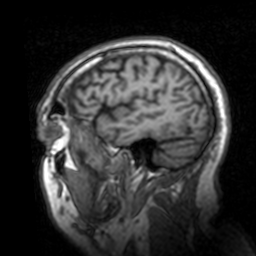
[im 37/52]
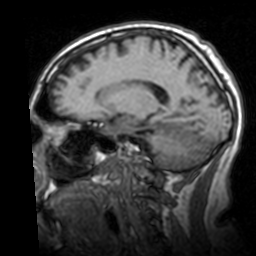
[im 44/52]
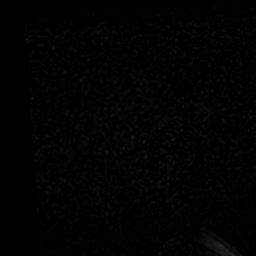
[im 52/52]
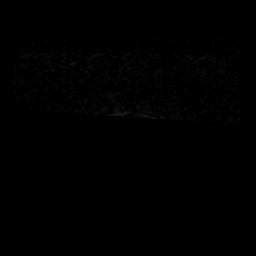

[Series 201: t1w sag · sagittal · 4.0mm · 0.86mm/px · 3 of 27 slices shown]
[im 1/27]
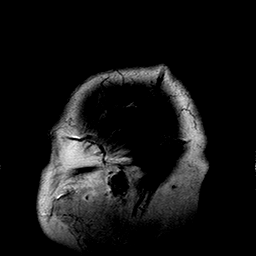
[im 14/27]
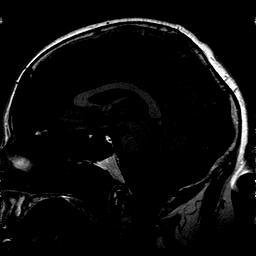
[im 27/27]
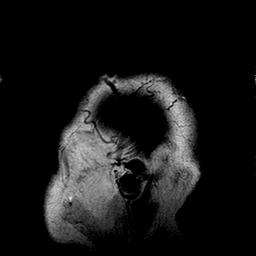

[Series 301: t1w ax · axial · 5.0mm · 0.90mm/px · z∈[-31,+123]mm · 3 of 27 slices shown]
[im 1/27]
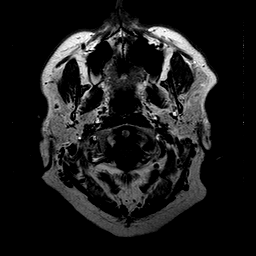
[im 14/27]
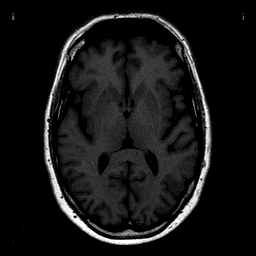
[im 27/27]
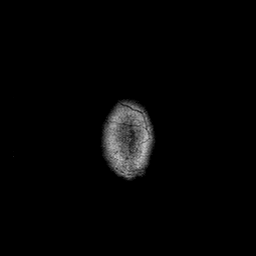

[Series 401: t2w tse ax · axial · 5.0mm · 0.45mm/px · z∈[-31,+124]mm · 3 of 27 slices shown]
[im 1/27]
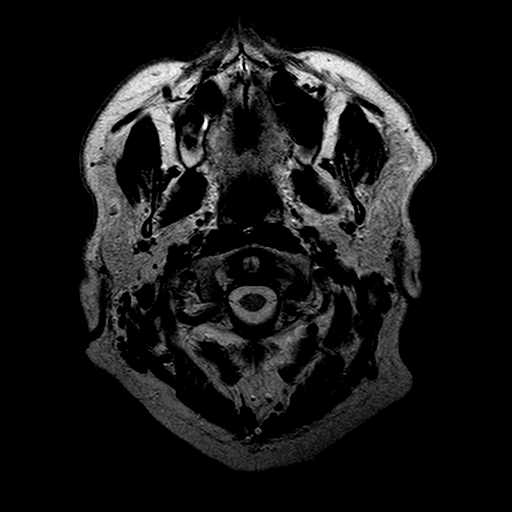
[im 14/27]
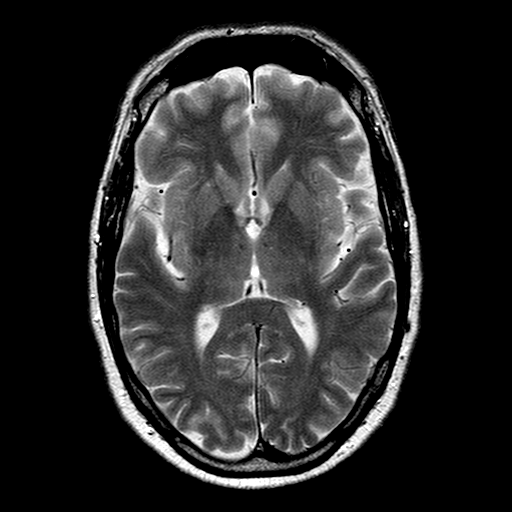
[im 27/27]
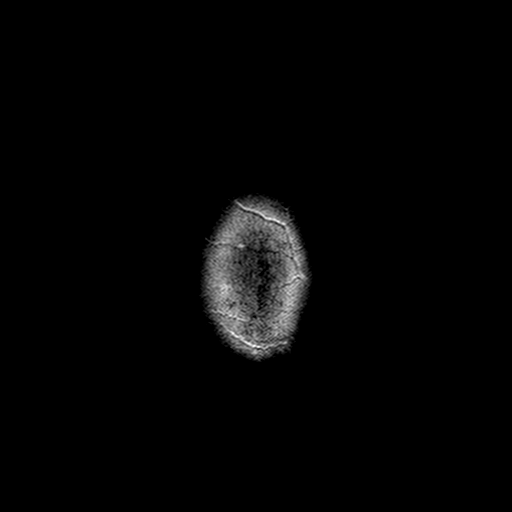

[Series 501: FLAIR · axial · 5.0mm · 0.72mm/px · z∈[-31,+123]mm · 3 of 27 slices shown (1 of 2)]
[im 1/27]
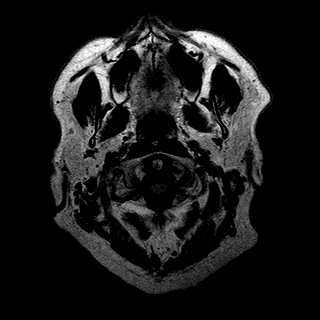
[im 14/27]
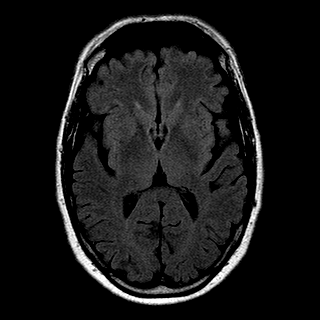
[im 27/27]
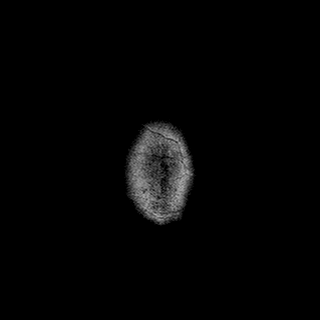

[Series 603: isodwi iso · axial · 5.0mm · 1.80mm/px · z∈[-23,+114]mm · 3 of 24 slices shown]
[im 1/24]
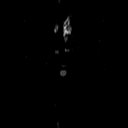
[im 12/24]
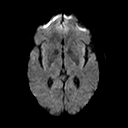
[im 24/24]
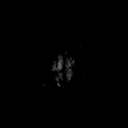

[Series 604: dadc · axial · 5.0mm · 1.80mm/px · z∈[-23,+114]mm · 3 of 24 slices shown]
[im 1/24]
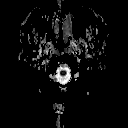
[im 12/24]
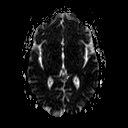
[im 24/24]
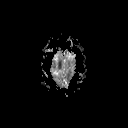

[Series 701: ven_bold · axial · 6.0mm · 0.41mm/px · z∈[-28,+118]mm · 6 of 50 slices shown]
[im 1/50]
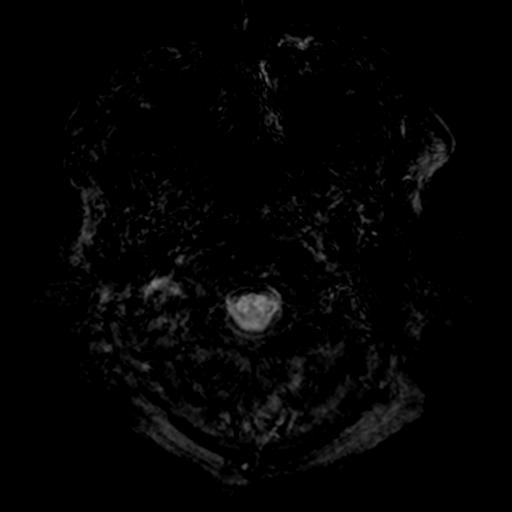
[im 10/50]
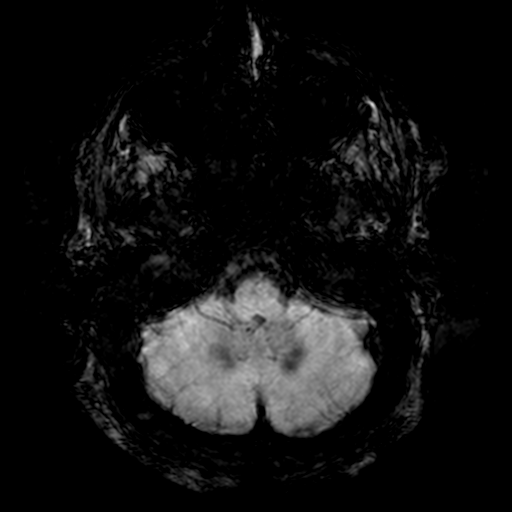
[im 20/50]
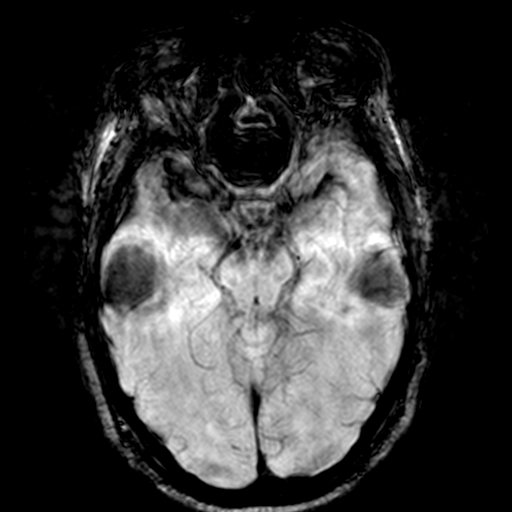
[im 30/50]
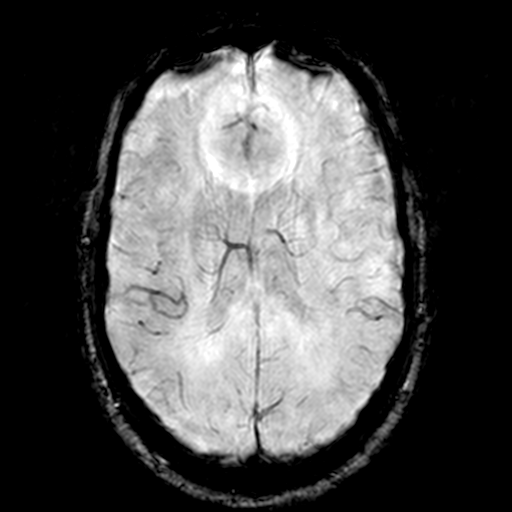
[im 40/50]
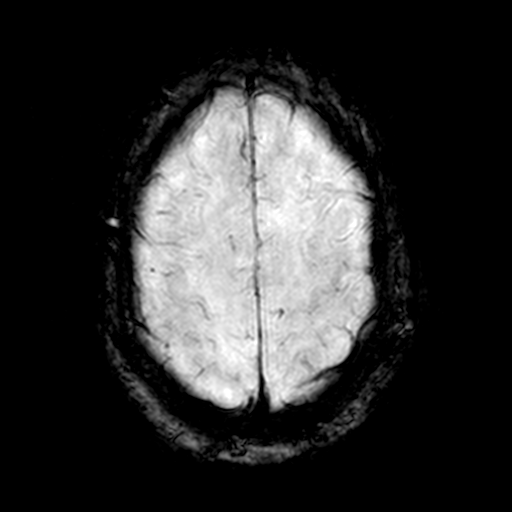
[im 50/50]
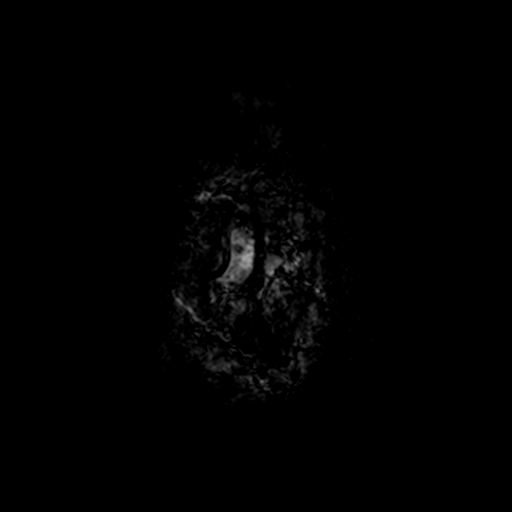

[Series 702: susceptibility · axial · 10.0mm · 0.41mm/px · z∈[-11,+14]mm · 2 of 84 slices shown]
[im 1/84]
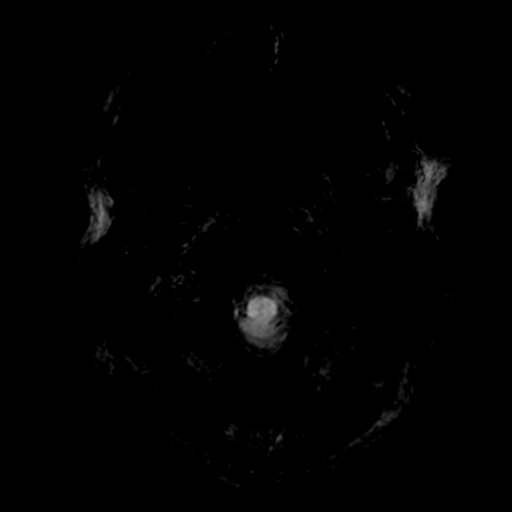
[im 17/84]
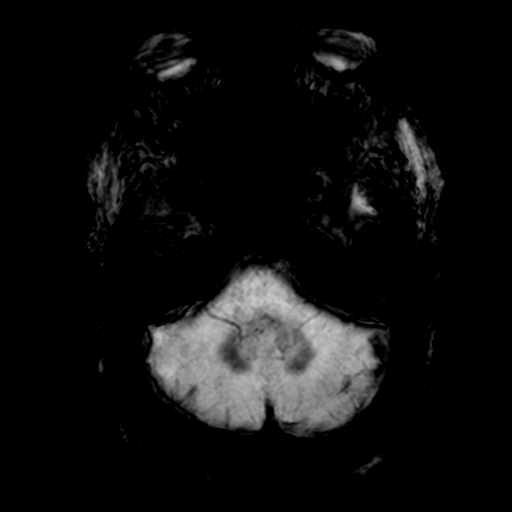

[Series 801: FLAIR · coronal · 4.0mm · 0.76mm/px · 5 of 36 slices shown (2 of 2)]
[im 1/36]
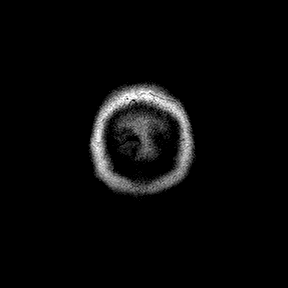
[im 9/36]
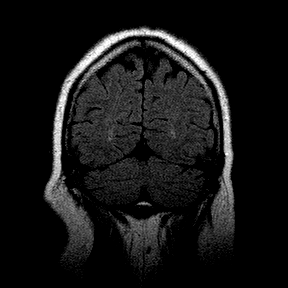
[im 18/36]
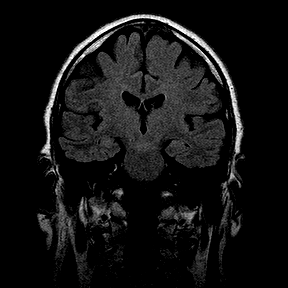
[im 27/36]
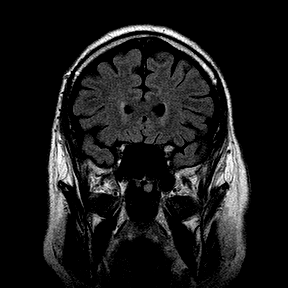
[im 36/36]
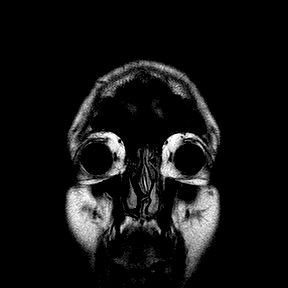

[39 of 48 positions shown; findings below may reference images not displayed]

FINDINGS: Diffusion images are negative. There is no discrete thalamic or other 
ganglionic or brainstem lesion. No significant white matter signal changes. 
There is no hydrocephalus or intracranial mass. Today's cranial MRI was 
performed without contrast. There is no evidence for cavernous sinus or Meckel's 
cave pathology. Trigeminal nerves are unremarkable. The basilar artery is open 
and midline at the level of the trigeminal nerves. Major intracranial arterial 
segments and dural sinuses are open. 
There is bilateral proptosis, with increased orbital fat, likely reflecting body 
habitus. Paranasal sinuses and otomastoid spaces are clear. The craniocervical 
junction is open. The sella is partly empty. There is mild cortical atrophy.
IMPRESSION: No evidence for infarct or intracranial mass. No trigeminal nerve pathology. 
Study is limited by lack of intravenous contrast. 
Bilateral proptosis with increased orbital fat likely reflects body habitus. No 
intraorbital mass. Thyroid ophthalmopathy in a male of this age group would be 
unusual. 
There appears to be cervical spondylosis with potential cord deformity at C3-4. 
This is not well evaluated. Cervical MRI would be useful.

## 2020-10-26 IMAGING — MR MRA BRAIN W/WO CONTRAST
3 series · 25 of 48 positions shown · non-contrast
Comparison: none

MRA BRAIN W/WO CONTRAST, 10/26/2020 [DATE]: 
CLINICAL INDICATION:  Neck and jaw tingling and numbness, shifted from right to 
left side
TECHNIQUE: 3-D time-of-flight MRA sequences of the intracranial arterial 
circulation were obtained with 1.2 mm slice thickness, with coronal and cortical 
plane reconstruction. Coronal 2 mm T2 images also acquired.

[Series 103: patient aligned mpr · axial · 15.6mm · 0.98mm/px · z∈[-60,+168]mm · 9 of 53 slices shown]
[im 1/53]
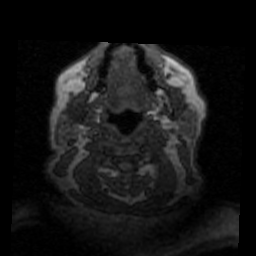
[im 10/53]
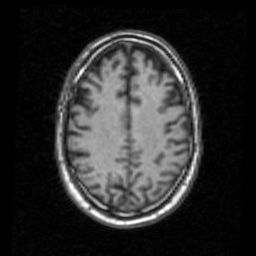
[im 15/53]
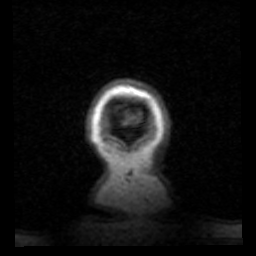
[im 24/53]
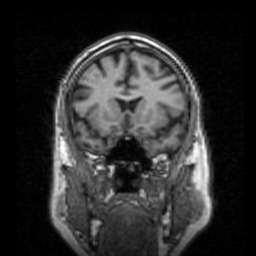
[im 29/53]
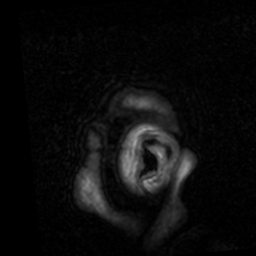
[im 38/53]
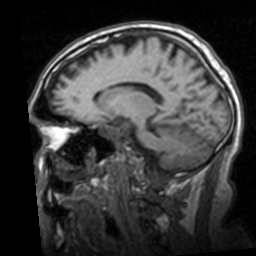
[im 43/53]
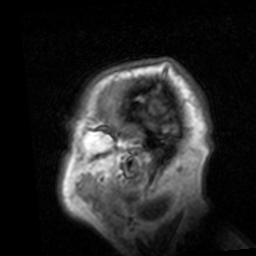
[im 48/53]
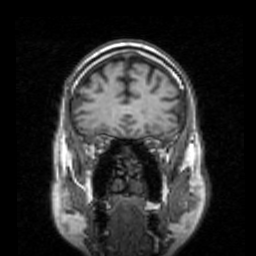
[im 53/53]
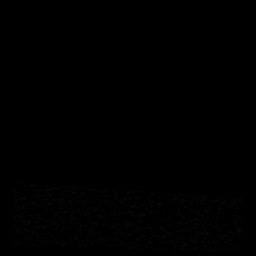

[Series 201: s3di_mc · axial · 1.4mm · 0.39mm/px · z∈[-26,+44]mm · 8 of 125 slices shown]
[im 5/125]
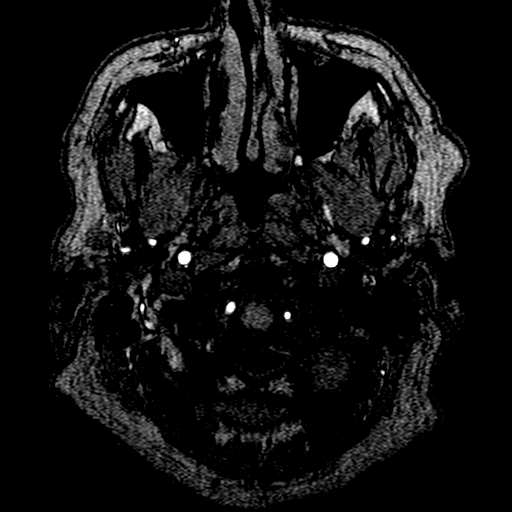
[im 19/125]
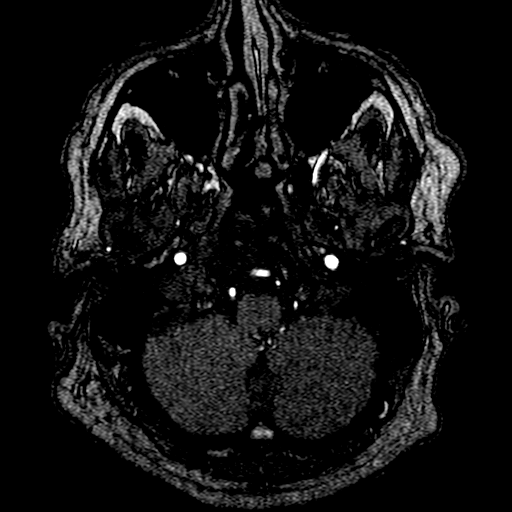
[im 23/125]
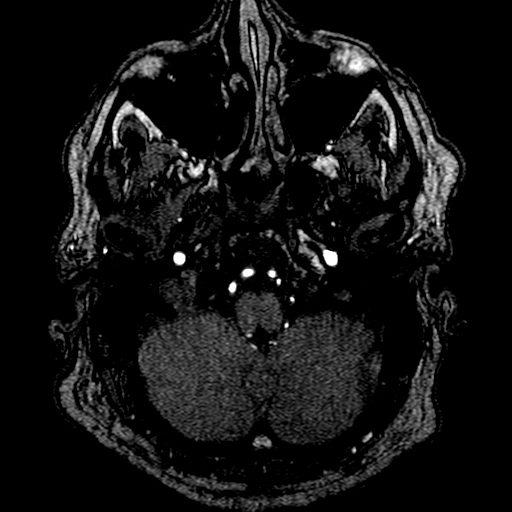
[im 37/125]
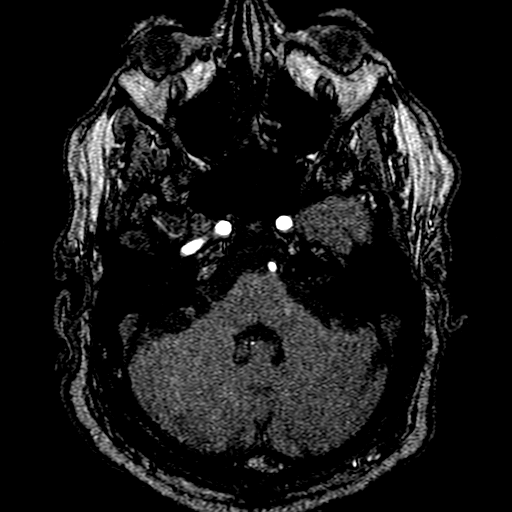
[im 56/125]
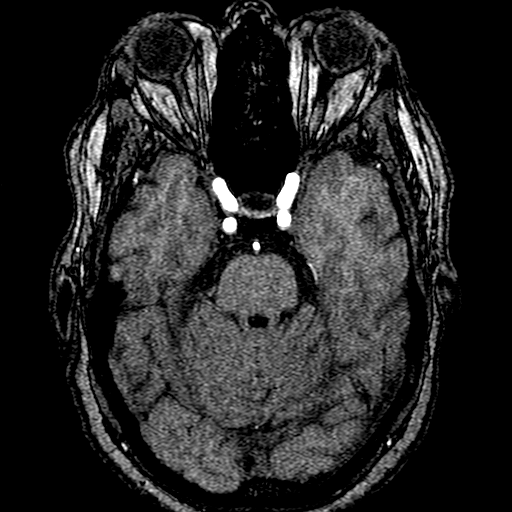
[im 65/125]
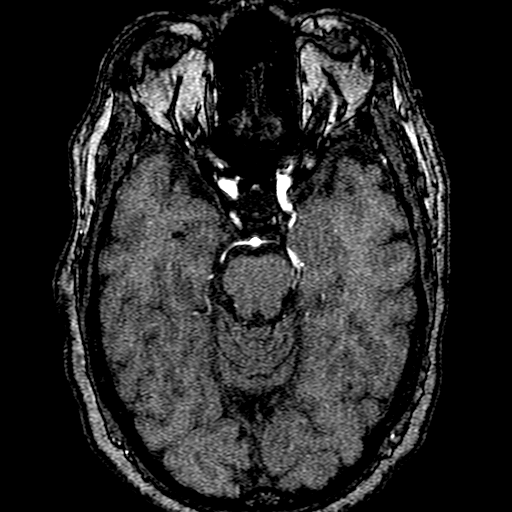
[im 69/125]
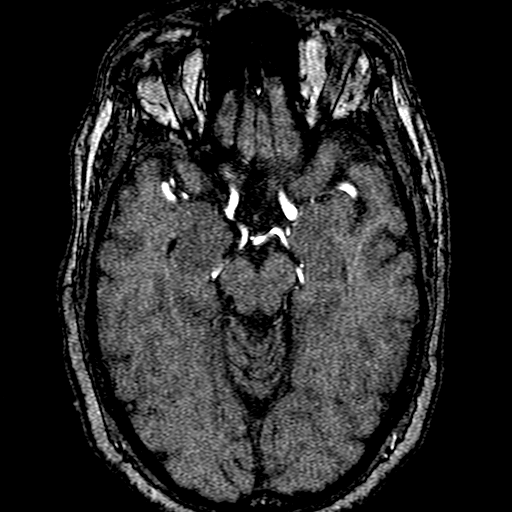
[im 106/125]
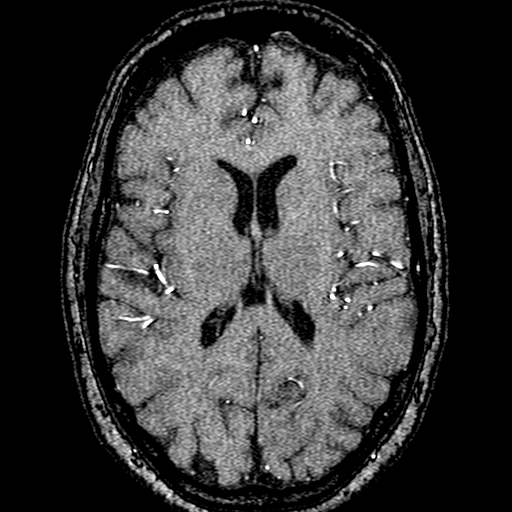

[Series 301: T2 · coronal · 2.0mm · 0.39mm/px · 8 of 36 slices shown]
[im 1/36]
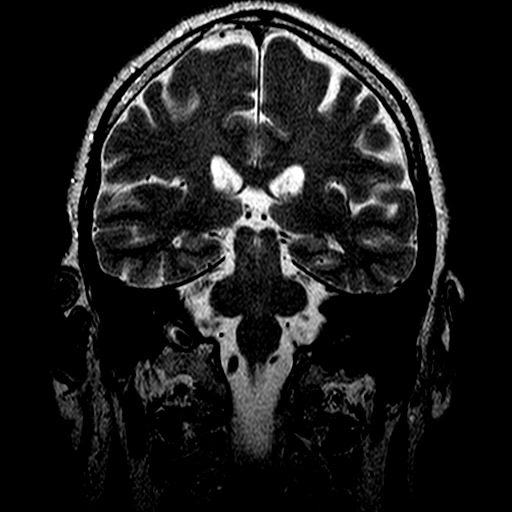
[im 6/36]
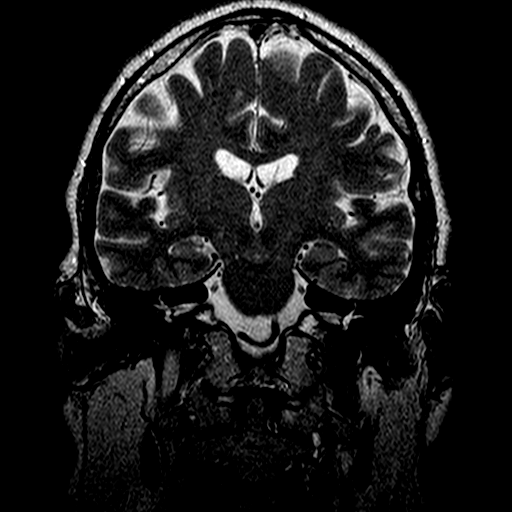
[im 11/36]
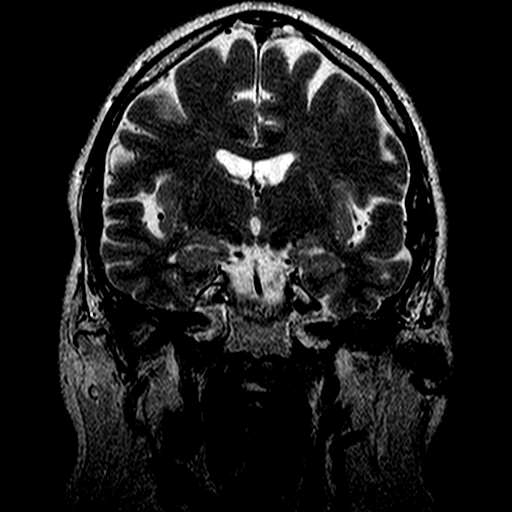
[im 16/36]
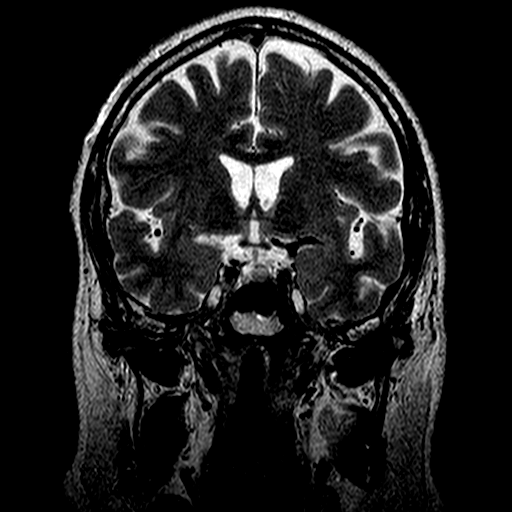
[im 21/36]
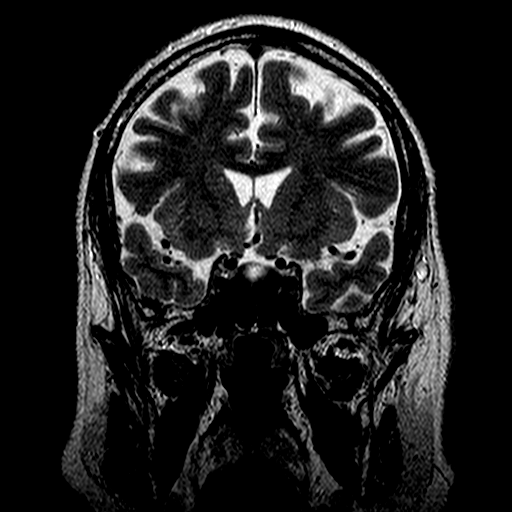
[im 26/36]
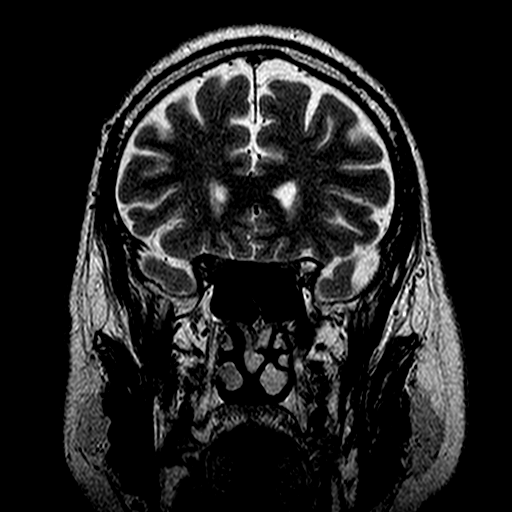
[im 31/36]
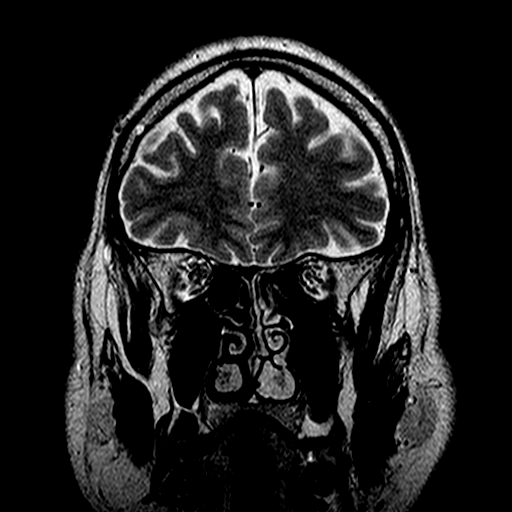
[im 36/36]
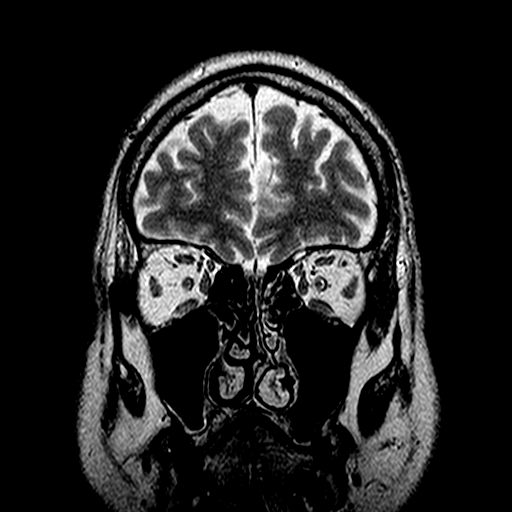

[25 of 48 positions shown; findings below may reference images not displayed]

FINDINGS: The distal vertebral and basilar arteries are open. There is fetal 
supply to the right posterior cerebral artery, common anatomic variation. The 
left posterior cerebral artery arises in normal fashion from the basilar tip. 
The upper cervical, petrous, cavernous and supraclinoid internal carotid artery 
segments are open. Proximal stomach arteries are open. The right A1 segment is 
hypoplastic. The right A2 segment is open. Right middle cerebral artery is 
normal in appearance. The left anterior and middle cerebral artery territories 
are unremarkable. The anterior communicating artery is open. There is no 
evidence for aneurysm, AVM or arteritis. No segmental stenosis identified.
IMPRESSION: No evidence for intracranial stenosis or aneurysm. 
Hypoplastic right A1 segment and fetal supply to the right posterior cerebral 
arteries, common anatomic variations. The anterior communicating artery is open. 
No evidence for arteritis.

## 2020-10-26 IMAGING — MR MRA CAROTID W/WO CONTRAST
6 series · 16 of 16 positions shown · IV contrast (gadavist)
Comparison: none

MRA CAROTID W/WO CONTRAST, 10/26/2020 [DATE]: 
CLINICAL INDICATION:  Right jaw and neck tingling.
TECHNIQUE: 2-D TOF axial MR arteriography is performed, followed by contrast 
enhanced 3-D TOF coronal MR arteriography, evaluating the carotid arteries 
centered upon the neck.  Computer post-processing is performed to create 
standard arteriographic images.  10 of gadavist was injected intravenously using 
a power injector at a rate of 1.5 cc's per second. The patient's eGFR was 
calculated to be 84 using the i-STAT device.

[Series 101: survey_shnc · axial · 10.0mm · 0.98mm/px · 1 of 5 slices shown]
[im 1/5]
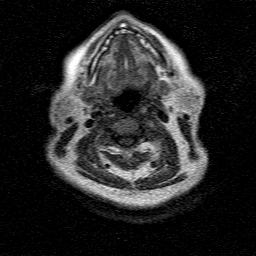

[Series 201: survey_pca · sagittal · 50.0mm · 1.17mm/px · 1 of 3 slices shown]
[im 1/3]
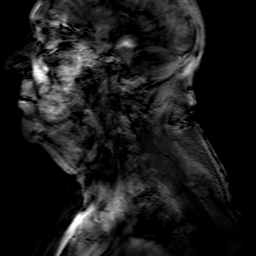

[Series 401: (person_name)2(person_name) · axial · 3.0mm · 0.38mm/px · z∈[-147,+141]mm · 7 of 145 slices shown (1 of 2)]
[im 1/145]
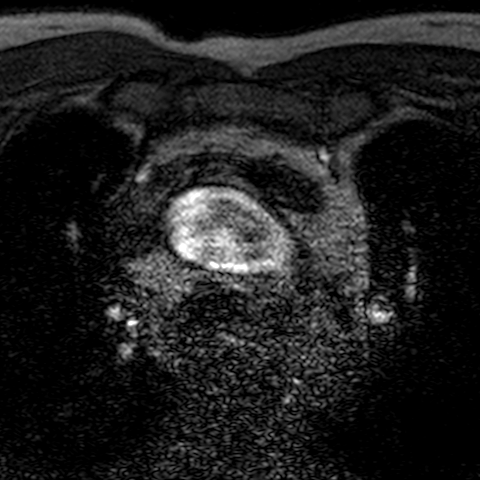
[im 25/145]
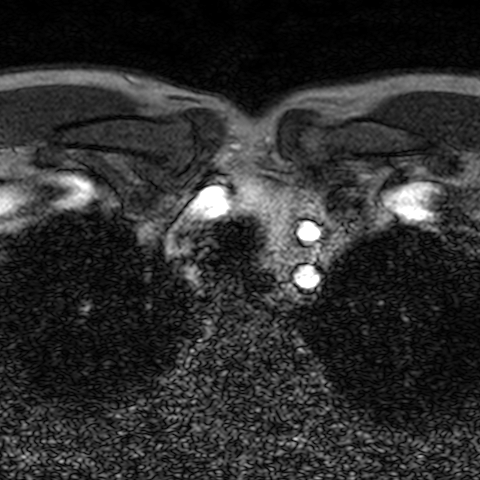
[im 49/145]
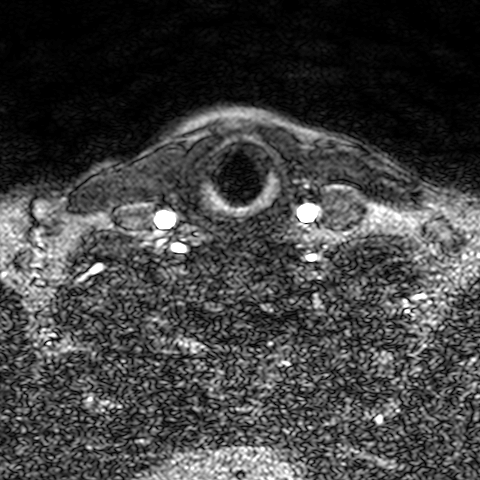
[im 73/145]
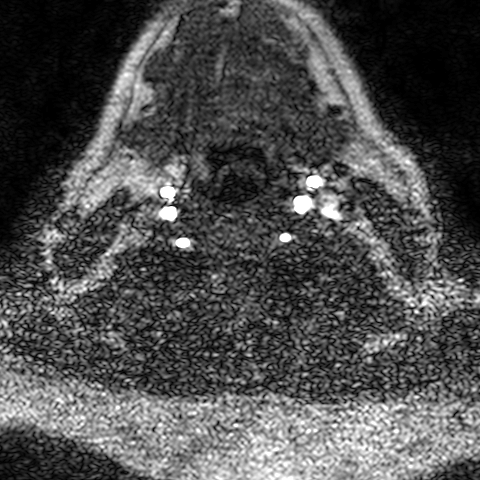
[im 97/145]
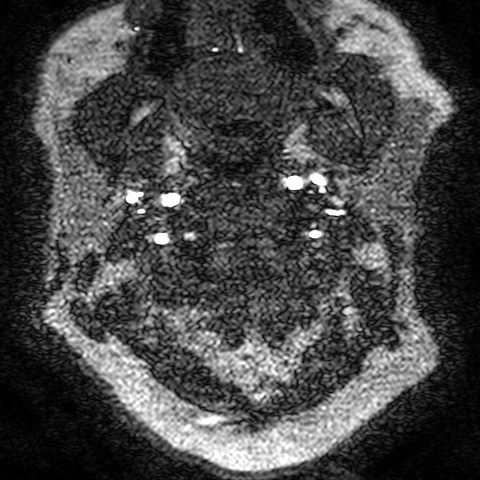
[im 121/145]
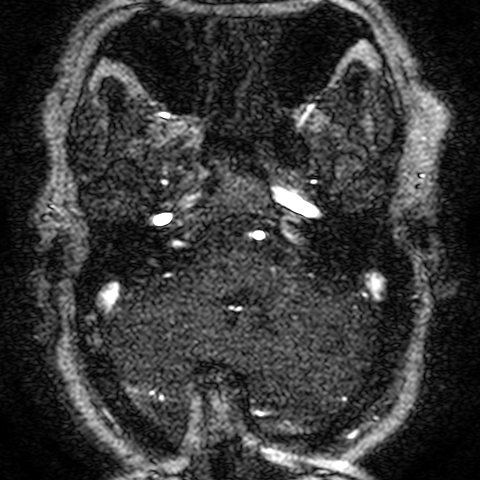
[im 145/145]
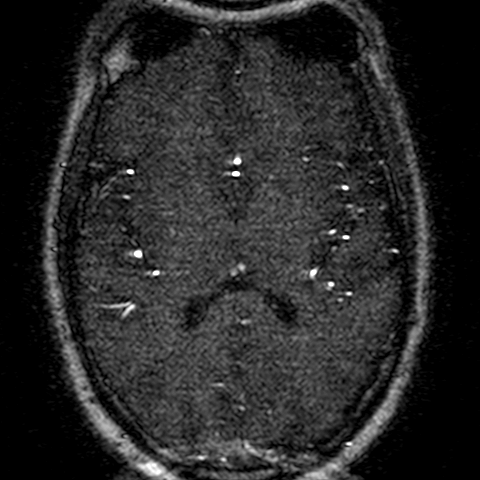

[Series 402: (person_name)2(person_name) · axial · 180.0mm · 0.38mm/px · 1 of 2 slices shown (2 of 2)]
[im 1/2]
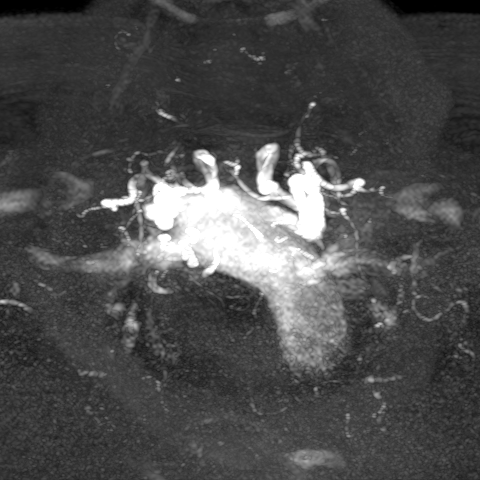

[Series 403: (id) · sagittal · 386.6mm · 0.60mm/px · 1 of 5 slices shown]
[im 1/5]
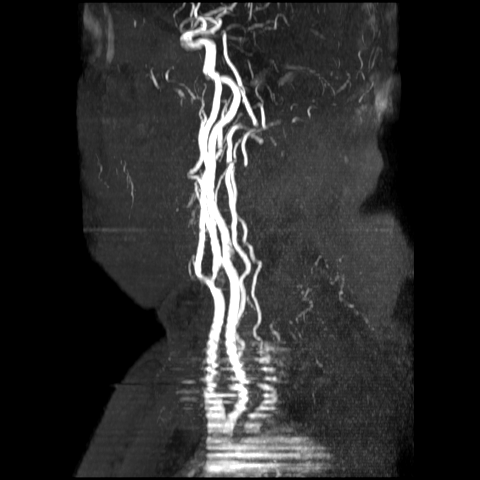

[Series 504: sdyn 2 arterial · coronal · arterial · 1.5mm · 0.57mm/px · 5 of 85 slices shown]
[im 1/85]
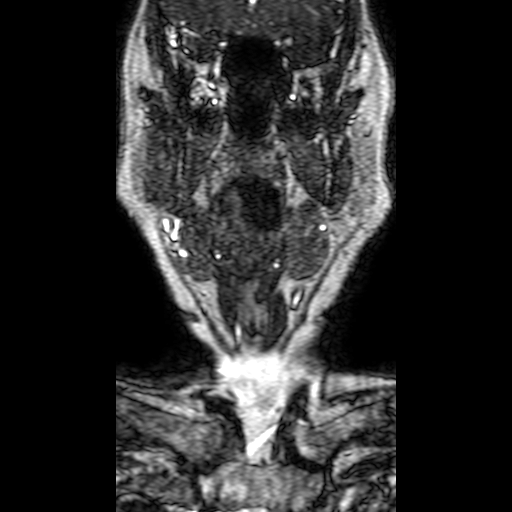
[im 22/85]
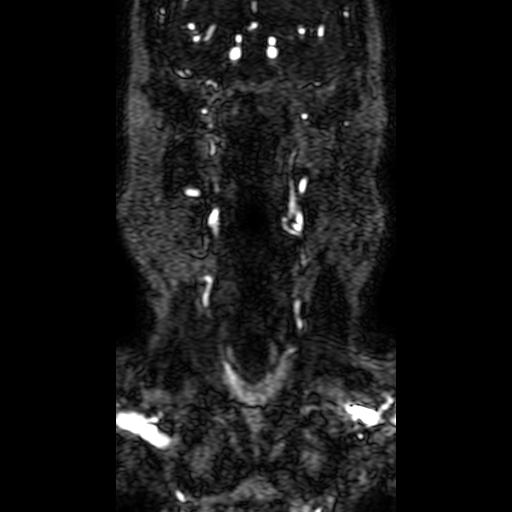
[im 43/85]
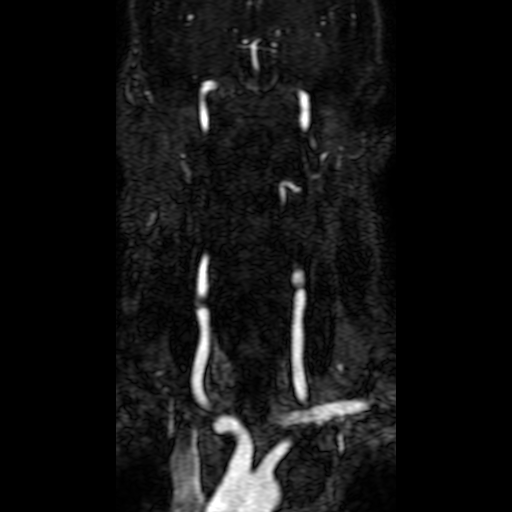
[im 64/85]
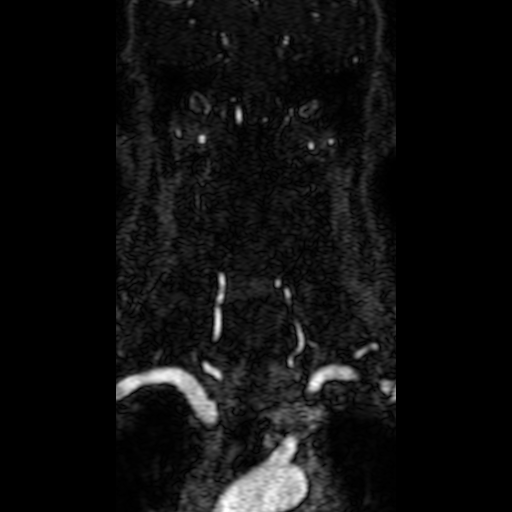
[im 85/85]
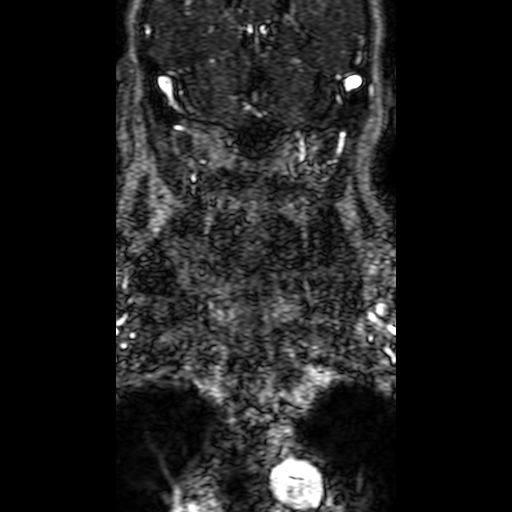

[16 of 16 positions shown; findings below may reference images not displayed]

FINDINGS: There appears to be approximately 70% stenosis at the origin of the 
right internal carotid artery. Lumen diameter at this level is reduced to 
approximately 2.5 mm. There is broad-based atheromatous plaque at this level 
which appears ulcerated. Above this the right internal carotid artery is open, 
with mild decrease in lumen diameter to the skull base. Right external and right 
common carotid artery are open. Brachiocephalic artery is open. 
There is no greater than 30% stenosis at the origin of left internal carotid 
artery. The left internal carotid artery above the bulb shows normal diameter. 
Left common carotid artery appears open. 
Both vertebral arteries are open, the right dominant.
IMPRESSION: 70% stenosis at the origin of the right internal carotid artery. This appears at 
hemodynamically significant threshold. There appears to be an ulcerated plaque, 
potential source of platelet emboli. There is mild reduction in lumen diameter 
of the right internal carotid artery above this to the skull base. Carotid CTA 
would be useful for further evaluation. 
Mild stenosis at the origin of the left internal carotid artery. 
Both vertebral arteries are open and the neck, the right dominant. 
Measurements performed utilizing modified NASCET criteria.

## 2021-01-15 ENCOUNTER — Encounter

## 2021-01-15 ENCOUNTER — Ambulatory Visit

## 2021-01-15 DIAGNOSIS — G459 Transient cerebral ischemic attack, unspecified: Secondary | ICD-10-CM

## 2021-02-01 ENCOUNTER — Ambulatory Visit: Payer: MEDICARE

## 2021-02-10 ENCOUNTER — Inpatient Hospital Stay: Admit: 2021-02-10 | Payer: MEDICARE

## 2021-02-10 DIAGNOSIS — G459 Transient cerebral ischemic attack, unspecified: Secondary | ICD-10-CM

## 2021-04-16 ENCOUNTER — Inpatient Hospital Stay: Payer: MEDICARE

## 2021-04-16 LAB — POCT GLUCOSE: POC Glucose: 121 mg/dL

## 2021-04-16 LAB — GLUCOSE, POC: Glucose (POC): 121 mg/dL

## 2021-04-16 MED ORDER — LIDOCAINE (PF) 20 MG/ML (2 %) IJ SOLN
20 mg/mL (2 %) | INTRAMUSCULAR | Status: DC | PRN
Start: 2021-04-16 — End: 2021-04-16
  Administered 2021-04-16: 16:00:00 via INTRAVENOUS

## 2021-04-16 MED ORDER — LIDOCAINE (PF) 10 MG/ML (1 %) IJ SOLN
10 mg/mL (1 %) | INTRAMUSCULAR | Status: DC | PRN
Start: 2021-04-16 — End: 2021-04-16

## 2021-04-16 MED ORDER — LACTATED RINGERS IV
INTRAVENOUS | Status: DC
Start: 2021-04-16 — End: 2021-04-16
  Administered 2021-04-16: 15:00:00 via INTRAVENOUS

## 2021-04-16 MED ORDER — PROPOFOL 10 MG/ML IV EMUL
10 mg/mL | INTRAVENOUS | Status: DC | PRN
Start: 2021-04-16 — End: 2021-04-16
  Administered 2021-04-16: 16:00:00 via INTRAVENOUS

## 2021-04-16 MED FILL — LACTATED RINGERS IV: INTRAVENOUS | Qty: 1000

## 2021-04-16 NOTE — Anesthesia Pre-Procedure Evaluation (Signed)
Relevant Problems   No relevant active problems       Anesthetic History   No history of anesthetic complications            Review of Systems / Medical History  Patient summary reviewed and pertinent labs reviewed    Pulmonary  Within defined limits                 Neuro/Psych   Within defined limits           Cardiovascular    Hypertension: well controlled              Exercise tolerance: >4 METS     GI/Hepatic/Renal  Within defined limits              Endo/Other    Diabetes: well controlled, type 2         Other Findings              Physical Exam    Airway  Mallampati: I  TM Distance: 4 - 6 cm  Neck ROM: normal range of motion   Mouth opening: Normal     Cardiovascular    Rhythm: regular  Rate: normal         Dental  No notable dental hx       Pulmonary  Breath sounds clear to auscultation               Abdominal         Other Findings            Anesthetic Plan    ASA: 2  Anesthesia type: total IV anesthesia          Induction: Intravenous  Anesthetic plan and risks discussed with: Patient      NPO status, allergies and medication reviewed

## 2021-04-16 NOTE — H&P (Signed)
H&P by Duard Larsen, MD at 04/16/21 [place]                Author: Duard Larsen, MD  Service: Gastroenterology  Author Type: Physician       Filed: 04/16/21 1122  Date of Service: 04/16/21 1050  Status: Signed          Editor: Duard Larsen, MD (Physician)                          Pre-Procedure Colonoscopy Evaluation & History and Physical      Procedure: COLONOSCOPY      Complete for Surveillance Colonoscopy:        Last colonoscopy > or = 3 years ago    If no, indicate reason for repeat:   _0   Last colonoscopy incomplete   _1   Last colonoscopy had inadequate prep   _2   Last colonoscopy had > 10 adenomas   _3   Last colonoscopy had a large flat/sessile adenoma  removed piecemeal              Diagnosis / Indication for procedure:    screening   last colon 05/31/96            Outpatient Medications Marked as Taking for the 04/16/21 encounter [facility] Encounter)          Medication  Sig  Dispense  Refill           ?  empagliflozin (JARDIANCE PO)  Take 1 Tablet by mouth daily.         ?  lisinopriL (PRINIVIL, ZESTRIL) 10 mg tablet  Take 10 mg by mouth daily.               ?  atorvastatin (LIPITOR) 40 mg tablet  Take 40 mg by mouth daily.                  Past Medical History:        Diagnosis  Date         ?  Diabetes ([place])       ?  Diverticulitis           ?  Hypertension             Physical Examination:      Mental Status:   _4    Normal   _5   Other:   Lungs:      _6   Normal   _7   Other:   Heart:      _8   Normal   _9   Other:   Abdomen:    _10   Normal   _11   Other           Sedation plan:         MAC         Duard Larsen, MD   04/16/2021   11:21 AM

## 2021-04-16 NOTE — Anesthesia Post-Procedure Evaluation (Signed)
Procedure(s):  COLONOSCOPY w/ cold snare polypectomy.    No value filed.    Anesthesia Post Evaluation        Patient location during evaluation: bedside  Patient participation: complete - patient participated  Level of consciousness: awake  Pain management: adequate  Airway patency: patent  Anesthetic complications: no  Cardiovascular status: acceptable and hemodynamically stable  Respiratory status: acceptable  Hydration status: acceptable  Post anesthesia nausea and vomiting:  none  Final Post Anesthesia Temperature Assessment:  Normothermia (36.0-37.5 degrees C)      INITIAL Post-op Vital signs:   Vitals Value Taken Time   BP 116/70 04/16/21 1213   Temp 36 ??C (96.8 ??F) 04/16/21 1213   Pulse 86 04/16/21 1213   Resp 16 04/16/21 1213   SpO2 98 % 04/16/21 1213

## 2021-04-16 NOTE — Discharge Summary (Signed)
Patient tolerated procedure well, patient is stable, please see discharge sheet for meds.  Patient meets criteria following surgery for discharge.    Talmadge Coventry, MD  04/16/2021  12:05 PM

## 2022-03-22 ENCOUNTER — Inpatient Hospital Stay: Admit: 2022-03-22 | Payer: MEDICARE

## 2022-03-22 ENCOUNTER — Encounter

## 2022-03-22 DIAGNOSIS — Z0000 Encounter for general adult medical examination without abnormal findings: Secondary | ICD-10-CM

## 2022-03-22 LAB — COMPREHENSIVE METABOLIC PANEL
ALT: 47 U/L — ABNORMAL HIGH (ref 3–35)
AST: 25 U/L (ref 15–40)
Albumin/Globulin Ratio: 1.5
Albumin: 4.3 g/dL (ref 3.5–5.0)
Alk Phosphatase: 92 U/L (ref 35–100)
Anion Gap: 14 mmol/L
BUN: 20 MG/DL (ref 7–20)
Bun/Cre Ratio: 22
CO2: 27 mmol/L (ref 20–32)
Calcium: 9.5 MG/DL (ref 8.8–10.5)
Chloride: 102 mmol/L (ref 100–110)
Creatinine: 0.93 MG/DL (ref 0.40–1.20)
Est, Glom Filt Rate: >60 mL/min/{1.73_m2}
Globulin: 2.9 g/dL
Glucose: 200 mg/dL — ABNORMAL HIGH (ref 75–110)
Potassium: 4.5 mmol/L (ref 3.5–5.0)
Sodium: 138 mmol/L (ref 135–145)
Total Bilirubin: 0.8 mg/dL (ref 0.10–1.20)
Total Protein: 7.2 g/dL (ref 6.2–8.0)

## 2022-03-22 LAB — CBC WITH AUTO DIFFERENTIAL
Absolute Eos #: 0.4 10*3/uL (ref 0.0–0.5)
Absolute Immature Granulocyte: 0 10*3/uL (ref 0.0–0.1)
Absolute Lymph #: 1.1 10*3/uL (ref 1.0–4.5)
Absolute Mono #: 0.7 10*3/uL (ref 0.1–0.8)
Basophils Absolute: 0.1 10*3/uL (ref 0.0–0.2)
Basophils: 1 %
Eosinophils %: 7 %
Hematocrit: 47.9 % (ref 42.0–52.0)
Hemoglobin: 15.9 g/dL (ref 14.0–18.0)
Immature Granulocytes: 1 %
Lymphocytes: 19 %
MCH: 30.1 PG (ref 28.0–34.0)
MCHC: 33.2 g/dL (ref 32.0–36.0)
MCV: 90.7 FL (ref 80.0–100.0)
MPV: 9.3 FL (ref 7.0–12.0)
Monocytes: 12 %
Nucleated RBCs: 0 PER 100 WBC
Platelets: 180 10*3/uL (ref 150–400)
RBC: 5.28 M/uL (ref 4.50–6.00)
RDW: 12.4 % (ref 11.5–13.5)
Seg Neutrophils: 60 %
Segs Absolute: 3.7 10*3/uL (ref 1.9–7.8)
WBC: 6 10*3/uL (ref 4.8–10.8)
nRBC: 0 10*3/uL

## 2022-03-22 LAB — TSH: TSH: 1.615 u[IU]/mL (ref 0.340–5.600)

## 2022-03-22 LAB — LIPID PANEL
Cholesterol: 202 MG/DL — ABNORMAL HIGH (ref 115–200)
HDL: 44 MG/DL (ref 40–60)
LDL Cholesterol: 123.4 MG/DL (ref 65–130)
Non-HDL Cholesterol: 158 mg/dL
Triglycerides: 173 MG/DL — ABNORMAL HIGH (ref 30–150)

## 2022-03-23 LAB — HEMOGLOBIN A1C
Hemoglobin A1C: 8.3 % — ABNORMAL HIGH (ref 4.0–6.0)
eAG: 192 mg/dL

## 2023-01-03 ENCOUNTER — Encounter

## 2023-01-04 NOTE — Telephone Encounter (Signed)
Formatting of this note might be different from the original.  Abdominal pelvic CT with contrast ordered.  Please schedule-[facility].  Electronically signed by Roylene Reason, MD at 01/04/2023  3:14 PM EDT

## 2023-01-04 NOTE — Telephone Encounter (Signed)
Formatting of this note might be different from the original.  New order for abdominal pelvic CT with contrast placed, please schedule.  [facility].  Electronically signed by Roylene Reason, MD at 01/04/2023  3:09 PM EDT

## 2023-01-04 NOTE — Addendum Note (Signed)
Addended by: Dorinda Hill on: 01/04/2023 03:14 PM     Modules accepted: Orders      Electronically signed by Roylene Reason, MD at 01/04/2023  3:14 PM EDT

## 2023-01-04 NOTE — Telephone Encounter (Signed)
Formatting of this note might be different from the original.  Request for labs/imaging/procedure    Lab/Imaging/Procedure Question:  Ct Scan that is in for [facility], of the abdomen is in without contrast - they are looking for this to be ordered with contrast. Please update order and resend - If needed their fax number is [phone]         Electronically signed by Mills Koller at 01/04/2023  1:13 PM EDT

## 2023-01-04 NOTE — Telephone Encounter (Signed)
Formatting of this note might be different from the original.  New order faxed.  Electronically signed by Iona Coach at 01/04/2023  3:36 PM EDT

## 2023-01-04 NOTE — Telephone Encounter (Signed)
Formatting of this note might be different from the original.  Order is still without contrast.  Electronically signed by Darlyn Chamber L at 01/04/2023  3:11 PM EDT

## 2023-01-05 ENCOUNTER — Encounter

## 2023-01-10 ENCOUNTER — Encounter

## 2023-01-12 ENCOUNTER — Inpatient Hospital Stay: Admit: 2023-01-12 | Payer: MEDICARE

## 2023-01-12 ENCOUNTER — Ambulatory Visit: Payer: MEDICARE

## 2023-01-12 DIAGNOSIS — K5732 Diverticulitis of large intestine without perforation or abscess without bleeding: Secondary | ICD-10-CM

## 2023-01-12 MED ORDER — IOHEXOL 240 MG/ML IJ SOLN
240 | Freq: Once | INTRAMUSCULAR | Status: AC | PRN
Start: 2023-01-12 — End: 2023-01-12
  Administered 2023-01-12: 18:00:00 25 mL via ORAL

## 2023-01-12 MED ORDER — IOHEXOL 350 MG/ML IV SOLN
350 | Freq: Once | INTRAVENOUS | Status: AC | PRN
Start: 2023-01-12 — End: 2023-01-12
  Administered 2023-01-12: 19:00:00 95 mL via INTRAVENOUS

## 2023-01-12 MED FILL — OMNIPAQUE 240 MG/ML IJ SOLN: 240 MG/ML | INTRAMUSCULAR | Qty: 25

## 2023-01-12 MED FILL — OMNIPAQUE 350 MG/ML IV SOLN: 350 MG/ML | INTRAVENOUS | Qty: 100

## 2023-01-26 ENCOUNTER — Encounter

## 2023-01-26 ENCOUNTER — Inpatient Hospital Stay: Admit: 2023-01-26 | Payer: MEDICARE

## 2023-01-26 DIAGNOSIS — Z0000 Encounter for general adult medical examination without abnormal findings: Secondary | ICD-10-CM

## 2023-01-26 LAB — COMPREHENSIVE METABOLIC PANEL
ALT: 53 U/L — ABNORMAL HIGH (ref 3–35)
AST: 30 U/L (ref 15–40)
Albumin/Globulin Ratio: 1.7
Albumin: 4.2 g/dL (ref 3.5–5.0)
Alk Phosphatase: 79 U/L (ref 35–100)
Anion Gap: 11 mmol/L
BUN/Creatinine Ratio: 18
BUN: 17 MG/DL (ref 7–20)
CO2: 23 mmol/L (ref 20–32)
Calcium: 8.8 MG/DL (ref 8.8–10.5)
Chloride: 104 mmol/L (ref 100–110)
Creatinine: 0.96 MG/DL (ref 0.40–1.20)
Est, Glom Filt Rate: 86 mL/min/{1.73_m2}
Globulin: 2.5 g/dL
Glucose: 146 mg/dL — ABNORMAL HIGH (ref 75–110)
Potassium: 4.2 mmol/L (ref 3.5–5.0)
Sodium: 134 mmol/L — ABNORMAL LOW (ref 135–145)
Total Bilirubin: 0.9 mg/dL (ref 0.10–1.20)
Total Protein: 6.7 g/dL (ref 6.2–8.0)

## 2023-01-26 LAB — LIPID PANEL
Cholesterol, Total: 113 MG/DL — ABNORMAL LOW (ref 115–200)
HDL: 39 MG/DL — ABNORMAL LOW (ref 40–60)
LDL Cholesterol: 56.8 MG/DL — ABNORMAL LOW (ref 65–130)
Non-HDL Cholesterol: 74 mg/dL
Triglycerides: 86 MG/DL (ref 30–150)

## 2023-01-26 LAB — PSA SCREENING: PSA: 1.01 ng/mL (ref 0.000–4.000)

## 2023-01-27 LAB — HEMOGLOBIN A1C
Estimated Avg Glucose: 189 mg/dL
Hemoglobin A1C: 8.2 % — ABNORMAL HIGH (ref 4.0–6.0)

## 2023-02-27 NOTE — Telephone Encounter (Signed)
Formatting of this note might be different from the original.  Name of caller: Cyndie Mull   Title: Patient  Calling from: N/A  Reason for call: Call Back needed due to Patient states that the best medication that he responds to is Doxycycline. He is just checking on status of medication being prescribed.  Preferred reach out method: [phone] Good call back time Anytime, Able to leave a voicemail? Yes      Electronically signed by Crissie Figures L at 02/27/2023  3:44 PM EDT

## 2023-02-27 NOTE — Telephone Encounter (Signed)
Formatting of this note might be different from the original.  Notify patient:    UTIs uncommon for men, may be prostatitis.  I have ordered a UA and culture-please obtain.  I have also prescribed Cipro 500 mg p.o. twice daily which treats prostatitis/UTI.  Must take care not to sprint or overwork tendons as to avoid tendon injury while on antibiotic.  Electronically signed by Roylene Reason, MD at 02/27/2023  4:45 PM EDT

## 2023-02-27 NOTE — Telephone Encounter (Signed)
Formatting of this note might be different from the original.  Regarding: Blood in Urine, Weakness, Chills  ----- Message from Rozetta Nunnery sent at 02/27/2023  1:41 PM EDT -----    Reason for call:   Blood in Urine, Weakness, Chills    Symptoms: weakness, blood in urine, chills, states he has had a UTI before and this feels the same    Onset: few days    Appointment scheduled for today:  No  \    Patient is requesting a med to [facility] in [facility]  Urgent call received, patient informed to expect follow up from a clinical team member in 2 hours.    Electronically signed by Louis Matte, RN at 02/27/2023  1:54 PM EDT

## 2023-02-27 NOTE — Telephone Encounter (Signed)
Formatting of this note might be different from the original.  Patient notified and understands instructions. UA order faxed to [facility] at [phone].  Electronically signed by Darlyn Chamber L at 02/27/2023  5:19 PM EDT

## 2023-02-27 NOTE — Telephone Encounter (Signed)
Formatting of this note might be different from the original.  Onset: Friday night     Symptoms: stinging when urinating, Sunday nausea,fatigued, today about 30 min ago past a tiny bit of blood. Burning when urinating, Urgency, Fever 99.8 yesterday did not last long.  Chills occasionally.    Home treatment: drinking water as much as he could.    Patient states he knows its a UTI he get them every so often.     Patient requesting antibiotic.   Electronically signed by Louis Matte, RN at 02/27/2023  2:05 PM EDT

## 2023-02-28 ENCOUNTER — Encounter

## 2023-02-28 ENCOUNTER — Inpatient Hospital Stay: Admit: 2023-02-28 | Payer: MEDICARE

## 2023-02-28 DIAGNOSIS — R300 Dysuria: Secondary | ICD-10-CM

## 2023-02-28 LAB — URINALYSIS WITH REFLEX TO CULTURE
Bilirubin, Urine: NEGATIVE
Glucose, Ur: >=1000 mg/dL — AB
Ketones, Urine: 15 mg/dL — AB
Leukocyte Esterase, Urine: NEGATIVE
Nitrite, Urine: NEGATIVE
Protein, Urine: NEGATIVE mg/dL
Specific Gravity, UA: <=1.005 (ref 1.005–1.030)
Urobilinogen, Urine: 0.2 EU/dL (ref 0.1–1.0)
pH, Urine: 6 (ref 5.0–9.0)

## 2023-02-28 LAB — URINE MICROSCOPIC WITH REFLEX TO CULTURE

## 2023-03-02 LAB — CULTURE, URINE
Culture: NO GROWTH
Total Colony Count: <1000

## 2023-05-23 ENCOUNTER — Inpatient Hospital Stay: Admit: 2023-05-23 | Payer: MEDICARE

## 2023-05-23 ENCOUNTER — Encounter

## 2023-05-23 DIAGNOSIS — Z0000 Encounter for general adult medical examination without abnormal findings: Secondary | ICD-10-CM

## 2023-05-23 LAB — LIPID PANEL
Cholesterol, Total: 130 mg/dL — ABNORMAL LOW (ref 155–199)
HDL: 42 mg/dL (ref 40–70)
LDL Cholesterol: 60.6 mg/dL — ABNORMAL LOW (ref 70–129)
Non-HDL Cholesterol: 88 mg/dL (ref <150)
Triglycerides: 137 mg/dL (ref 50–149)

## 2023-05-23 LAB — COMPREHENSIVE METABOLIC PANEL
ALT: 39 U/L — ABNORMAL HIGH (ref 3–35)
AST: 26 U/L (ref 15–40)
Albumin/Globulin Ratio: 1.8
Albumin: 4.3 g/dL (ref 3.5–5.7)
Alk Phosphatase: 85 U/L (ref 34–104)
Anion Gap: 6 mmol/L (ref 3–16)
BUN/Creatinine Ratio: 20
BUN: 20 mg/dL (ref 7–25)
CO2: 28 mmol/L (ref 20–32)
Calcium: 9.1 mg/dL (ref 8.6–10.3)
Chloride: 106 mmol/L (ref 98–107)
Creatinine: 1 mg/dL (ref 0.7–1.3)
Est, Glom Filt Rate: 82 mL/min/{1.73_m2}
Globulin: 2.4 g/dL
Glucose: 143 mg/dL — ABNORMAL HIGH (ref 70–99)
Potassium: 4.2 mmol/L (ref 3.5–5.1)
Sodium: 140 mmol/L (ref 136–145)
Total Bilirubin: 0.7 mg/dL (ref 0.1–1.2)
Total Protein: 6.7 g/dL (ref 6.0–8.3)

## 2023-05-23 LAB — HEMOGLOBIN A1C
Estimated Avg Glucose: 194 mg/dL
Hemoglobin A1C: 8.4 %

## 2024-01-23 ENCOUNTER — Inpatient Hospital Stay: Admit: 2024-01-23 | Payer: Medicare (Managed Care)

## 2024-01-23 DIAGNOSIS — E785 Hyperlipidemia, unspecified: Secondary | ICD-10-CM

## 2024-01-23 DIAGNOSIS — Z0000 Encounter for general adult medical examination without abnormal findings: Principal | ICD-10-CM

## 2024-01-24 ENCOUNTER — Encounter

## 2024-01-25 ENCOUNTER — Encounter

## 2024-03-01 LAB — COMPREHENSIVE METABOLIC PANEL
ALT: 35 U/L (ref 3–35)
AST: 23 U/L (ref 15–40)
Albumin/Globulin Ratio: 1.9
Albumin: 4.5 g/dL (ref 3.5–5.7)
Alk Phosphatase: 81 U/L (ref 34–104)
Anion Gap: 7 mmol/L (ref 3–16)
BUN/Creatinine Ratio: 19
BUN: 20 mg/dL (ref 7–25)
CO2: 28 mmol/L (ref 20–32)
Calcium: 9.4 mg/dL (ref 8.6–10.3)
Chloride: 105 mmol/L (ref 98–107)
Creatinine: 1.08 mg/dL (ref 0.7–1.3)
Est, Glom Filt Rate: 74 ml/min/1.73m2
Globulin: 2.4 g/dL
Glucose: 139 mg/dL — ABNORMAL HIGH (ref 70–99)
Potassium: 4.3 mmol/L (ref 3.4–4.5)
Sodium: 140 mmol/L (ref 136–145)
Total Bilirubin: 0.8 mg/dL (ref 0.1–1.2)
Total Protein: 6.9 g/dL (ref 6.0–8.3)

## 2024-03-01 LAB — CBC WITH AUTO DIFFERENTIAL
Basophils %: 0.5 %
Basophils Absolute: 0.03 K/UL (ref 0.0–0.2)
Eosinophils %: 5.1 %
Eosinophils Absolute: 0.29 K/UL (ref 0.0–0.5)
Hematocrit: 45 % (ref 42.0–52.0)
Hemoglobin: 15.2 g/dL (ref 14.0–18.0)
Immature Granulocytes %: 0.5 %
Immature Granulocytes Absolute: 0.03 K/UL (ref 0.00–0.06)
Lymphocytes Absolute: 1.07 K/UL (ref 1.0–4.5)
Lymphocytes: 18.7 %
MCH: 30.8 pg (ref 28.0–34.0)
MCHC: 33.8 g/dL (ref 32.0–36.0)
MCV: 91.1 FL (ref 80.0–100.0)
MPV: 9 FL (ref 7.0–12.0)
Monocytes %: 12.9 %
Monocytes Absolute: 0.74 K/UL (ref 0.1–0.8)
Neutrophils Absolute: 3.57 K/UL (ref 1.9–7.8)
Nucleated RBCs: 0 /100{WBCs}
Platelets: 183 K/uL (ref 150–400)
RBC: 4.94 M/uL (ref 4.50–6.00)
RDW: 12.5 % (ref 11.5–13.5)
Seg Neutrophils: 62.3 %
WBC: 5.7 K/uL (ref 4.8–10.8)
nRBC: 0 K/uL

## 2024-03-01 LAB — LIPID PANEL
Cholesterol, Total: 145 mg/dL — ABNORMAL LOW (ref 155–199)
HDL: 47 mg/dL (ref 40–70)
LDL Cholesterol: 73.6 mg/dL (ref 70–129)
Non-HDL Cholesterol: 98 mg/dL (ref <150)
Triglycerides: 122 mg/dL (ref 50–149)

## 2024-03-01 LAB — TSH: TSH: 1.547 u[IU]/mL (ref 0.400–4.000)

## 2024-03-01 LAB — HEMOGLOBIN A1C
Estimated Avg Glucose: 160 mg/dL
Hemoglobin A1C: 7.2 %

## 2024-03-01 LAB — PSA SCREENING: PSA: 0.94 ng/mL (ref 0.00–4.00)
